# Patient Record
Sex: Female | Born: 1970
Health system: Southern US, Community
[De-identification: ages and names within clinical notes are randomized; demographics above are authoritative.]

---

## 2004-11-11 ENCOUNTER — Ambulatory Visit: Payer: Self-pay | Admitting: Sports Medicine

## 2005-07-11 ENCOUNTER — Ambulatory Visit: Payer: Self-pay | Admitting: Sports Medicine

## 2005-08-08 ENCOUNTER — Ambulatory Visit: Payer: Self-pay | Admitting: Sports Medicine

## 2007-11-08 ENCOUNTER — Encounter: Payer: Self-pay | Admitting: Sports Medicine

## 2007-11-18 ENCOUNTER — Encounter: Payer: Self-pay | Admitting: Sports Medicine

## 2007-12-23 ENCOUNTER — Encounter: Payer: Self-pay | Admitting: Sports Medicine

## 2007-12-24 ENCOUNTER — Encounter: Payer: Self-pay | Admitting: Sports Medicine

## 2007-12-31 ENCOUNTER — Ambulatory Visit: Payer: Self-pay | Admitting: Sports Medicine

## 2007-12-31 DIAGNOSIS — M21619 Bunion of unspecified foot: Secondary | ICD-10-CM

## 2007-12-31 DIAGNOSIS — S3210XA Unspecified fracture of sacrum, initial encounter for closed fracture: Secondary | ICD-10-CM | POA: Insufficient documentation

## 2007-12-31 DIAGNOSIS — S322XXA Fracture of coccyx, initial encounter for closed fracture: Secondary | ICD-10-CM

## 2009-04-27 ENCOUNTER — Ambulatory Visit: Payer: Self-pay | Admitting: Sports Medicine

## 2009-04-27 DIAGNOSIS — M25559 Pain in unspecified hip: Secondary | ICD-10-CM

## 2009-04-27 DIAGNOSIS — M79609 Pain in unspecified limb: Secondary | ICD-10-CM

## 2010-10-13 ENCOUNTER — Encounter: Payer: Self-pay | Admitting: Sports Medicine

## 2010-10-13 ENCOUNTER — Encounter (INDEPENDENT_AMBULATORY_CARE_PROVIDER_SITE_OTHER): Payer: PRIVATE HEALTH INSURANCE | Admitting: Sports Medicine

## 2010-10-13 DIAGNOSIS — M21619 Bunion of unspecified foot: Secondary | ICD-10-CM

## 2010-10-13 DIAGNOSIS — M79609 Pain in unspecified limb: Secondary | ICD-10-CM

## 2010-10-18 NOTE — Assessment & Plan Note (Signed)
Summary: ORTHOTICS,MC   Vital Signs:  Patient profile:   40 year old female Height:      66 inches Weight:      120 pounds BMI:     19.44 Pulse rate:   75 / minute BP sitting:   109 / 75  (left arm)  Vitals Entered By: Rochele Pages RN (October 13, 2010 3:57 PM) CC: orthotics and rt foot pain   CC:  orthotics and rt foot pain.  History of Present Illness: Jniya returns for new orthotics now up to running as much as 18 mi trail runs with no pain until recently orthotics starting to give less support in arch now with some recurrent ankle pain comes for reevaluation great toe pain is controlled w orthotics  Preventive Screening-Counseling & Management  Alcohol-Tobacco     Smoking Status: never  Physical Exam  General:  Well-developed,well-nourished,in no acute distress; alert,appropriate and cooperative throughout examination Msk:  good alignment feet show bunion change 1st MTP RT and less so on left good arch running form pronates without orthotics   Impression & Recommendations:  Problem # 1:  BUNION, RIGHT FOOT (ICD-727.1)  Patient was fitted for a : standard, cushioned, semi-rigid orthotic. The orthotic was heated and afterward the patient stood on the orthotic blank positioned on the orthotic stand. The patient was positioned in subtalar neutral position and 10 degrees of ankle dorsiflexion in a weight bearing stance. After completion of molding, a stable base was applied to the orthotic blank. The blank was ground to a stable position for weight bearing. Size: 8 blue swirl Base: EVA Posting: None Additional orthotic padding: None  Note we also added some new heel padding tapered to heels of old orthotics  Walking and running gait neutral with orthotics  Orders: [employer], each unit [phone])  Problem # 2:  FOOT PAIN, BILATERAL (ICD-729.5) Assessment: Improved  much imporved using orthoitcs  will cont these  reck as needed and to replace as  needed  Orders: [employer], each unit (L3002)   Orders Added: 1)  Est. Patient Level III [60454] 2)  Orthotic Materials, each unit [L3002]

## 2013-12-17 ENCOUNTER — Ambulatory Visit (INDEPENDENT_AMBULATORY_CARE_PROVIDER_SITE_OTHER): Payer: 59 | Admitting: Sports Medicine

## 2013-12-17 ENCOUNTER — Encounter: Payer: Self-pay | Admitting: Sports Medicine

## 2013-12-17 VITALS — BP 143/82 | Ht 66.0 in | Wt 120.0 lb

## 2013-12-17 DIAGNOSIS — S76319A Strain of muscle, fascia and tendon of the posterior muscle group at thigh level, unspecified thigh, initial encounter: Secondary | ICD-10-CM

## 2013-12-17 DIAGNOSIS — IMO0002 Reserved for concepts with insufficient information to code with codable children: Secondary | ICD-10-CM

## 2013-12-17 MED ORDER — NITROGLYCERIN 0.2 MG/HR TD PT24: MEDICATED_PATCH | TRANSDERMAL | Status: DC

## 2013-12-17 NOTE — Patient Instructions (Signed)
Thank you for coming in today  1. You have a high hamstring strain 2. Sit on a donut or pillow 3. May start jogging if not limping. No downhill or speed. No running 2 consecutive days. 4. Wear compression sleeve during exercise 5. No static stretching, just rehab. 6. Heel lift in running shoes 7. Nitro patch  Nitroglycerin Protocol   Apply 1/4 nitroglycerin patch to affected area daily.  Change position of patch within the affected area every 24 hours.  You may experience a headache during the first 1-2 weeks of using the patch, these should subside.  If you experience headaches after beginning nitroglycerin patch treatment, you may take your preferred over the counter pain reliever.  Another side effect of the nitroglycerin patch is skin irritation or rash related to patch adhesive.  Please notify our office if you develop more severe headaches or rash, and stop the patch.  Tendon healing with nitroglycerin patch may require 12 to 24 weeks depending on the extent of injury.  Men should not use if taking Viagra, Cialis, or Levitra.   Do not use if you have migraines or rosacea.

## 2013-12-17 NOTE — Progress Notes (Signed)
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 She pulled some weeds and felt like she strained her high hamstring. 2 days later she ran a half marathon race and notes that the pain was severely worse. Since that time she has not been able to walk or run without pain. She has tried rest, ice, and ibuprofen. She took prescription strength ibuprofen for several days but this did not really help her. She does note pain with sitting. She is concerned she does not feel like she is getting better despite rest.  Her current workout routine includes running 4 days a week for a total of 30 miles per week as well as biking one day a week and doing strict training 4 days a week.  ROS: As above in the HPI. All other systems are stable or negative.  OBJECTIVE: APPEARANCE:  Patient in no acute distress.The patient appeared well nourished and normally developed. HEENT: No scleral icterus. Conjunctiva non-injected Resp: Non labored Skin: No rash MSK:  Left hamstring: - 5 out of 5 strength on hamstring testing with knee flexion and hip extension. She does have mild discomfort with hip extension against resistance. - No palpable defect - Mild tenderness to palpation at the lateral aspect of the left ischial tuberosity at the insertion of the biceps femoris - Pain with hamstring H. test but with equal flexibility - Pain with diver exercises  MSK US: Limited ultrasound of the right proximal hamstring was performed in transverse and longitudinal views today. There was a hyperechoic fragment consistent with a small avulsion off of the fascial tuberosity it was present in both transverse and longitudinal views. Suspect that this is a small a avulsion from biceps femoris  ASSESSMENT: #1. Left high hamstring strain   PLAN: We will add a heel lift in her shoe.  She will wear a compression sleeve over her hamstring while exercising. She should sit on a doughnut or pillow to relieve pressure and pain while sitting. She may start to gradually ease back into jogging when she is able to do so without limping. She was encouraged to avoid downhill running and speed work. She should return to running very gradually. She was given the Askling hamstring protocol to start rehabilitation. We will also start a nitroglycerin patch. We will see her back in 6 weeks or sooner if needed.

## 2014-01-28 ENCOUNTER — Encounter: Payer: Self-pay | Admitting: Sports Medicine

## 2014-01-28 ENCOUNTER — Ambulatory Visit (INDEPENDENT_AMBULATORY_CARE_PROVIDER_SITE_OTHER): Payer: 59 | Admitting: Sports Medicine

## 2014-01-28 VITALS — BP 106/72 | Ht 66.0 in | Wt 118.0 lb

## 2014-01-28 DIAGNOSIS — M6580 Other synovitis and tenosynovitis, unspecified site: Secondary | ICD-10-CM

## 2014-01-28 DIAGNOSIS — M76899 Other specified enthesopathies of unspecified lower limb, excluding foot: Secondary | ICD-10-CM

## 2014-01-28 NOTE — Patient Instructions (Signed)
You are healing well. Continue to wear your sleeve and the left heel lift. After 3 weeks you can try with and without the heel lift. Continue the nitroglycerin patches. Add 5 miles per week to your distance. You can begin to jog downhill and run uphill. Continue exercises. Add dynamic exercises: -Hop on on one leg forward. Do 3 sets of 10. Do this 3 times per week at the end of your run when you are loose. -Do easy bounding long strides 10 times across about 75 yards, during 2-3 runs per week -Gradually add the Nordic hamstring exercises. -In 3 weeks you can begin to run downhill. -After your runs you can do easy static stretches. -Followup in 6 weeks or sooner as needed.

## 2014-01-28 NOTE — Progress Notes (Signed)
Subjective:   CC: Followup left high hamstring strain  HPI:   This is a very pleasant 43 year old female with a history of left high hamstring strain with small avulsion fracture. She was seen in a little over a month ago and prescribed a compression sleeve, gradually easing back into exercise, avoiding downhill running, Askling hamstring protocol for rehabilitation, and nitroglycerin patch. A heel lift was also added into her left shoe. She reports that she has been doing rehabilitation exercises consistently and wearing the compression sleeve. She has also intermittently used the nitroglycerin patch. She is now able to slowly jog on a soft flat or mildly rolling terrain. Towards the end of running she can feel muscles but they are not sore. She did have a small setback when she picked strawberries a few weeks ago because she was bending for greater than 30 minutes. She still occasionally feels a dull ache at the upper left thigh around her buttock especially if she does too much bending or running. She is not limping. She has questions about how she can progress at this point.  She rates her pain as 80% less than original injury p 6 wks of RX (9wks since injury)  Review of Systems - Per HPI.  Social history: Patient is a long distance mountain runner    Objective:  Physical Exam BP 106/72  Ht 5\' 6"  (1.676 m)  Wt 118 lb (53.524 kg)  BMI 19.05 kg/m2 GEN: NAD Extremities: Mild tenderness at left ischial tuberosity 5 out of 5 left knee flexion Left hip flexion is within 5 of right, within slight increased bend in the left knee Normal gait while walking, decreased left leg and left while running Normal pelvic rock Normal leg alignment 5 out of 5 hip abduction on the left with foot inversion and eversion    MSK ultrasound: Left ischial avulsion still present but decreased in size, small ischial bursitis is present, small amount of edema within hamstring and below  Assessment:      Jacqueline Michael is a 43 y.o. female with h/o high HS syndrome here for f/u    Plan:     # See problem list and after visit summary for problem-specific plans. HS tendinitis protocol  # Health Maintenance: cont f/u of allergy issues  Follow-up: Follow up in 6wks for reck and repeat US   Leona SingletonMaria T Thekkekandam, MD [facility]   Pasty ArchEdited   KB Fields, MD

## 2014-01-28 NOTE — Assessment & Plan Note (Signed)
This is improved by symptoms and on scan  Small amount of ischial bursitis probably causes pain with sitting  Keep up NTG protocol for 6 more weeks HS sleeve Heel lift Start modifying these at 3 to 6 wks  OK to gradually increase training  Add hops, bounding and easy stretching  Reck 6 wks

## 2014-03-10 ENCOUNTER — Encounter: Payer: Self-pay | Admitting: Sports Medicine

## 2014-03-10 ENCOUNTER — Ambulatory Visit (INDEPENDENT_AMBULATORY_CARE_PROVIDER_SITE_OTHER): Payer: 59 | Admitting: Sports Medicine

## 2014-03-10 VITALS — BP 113/74 | HR 65 | Ht 66.0 in | Wt 120.0 lb

## 2014-03-10 DIAGNOSIS — M6580 Other synovitis and tenosynovitis, unspecified site: Secondary | ICD-10-CM

## 2014-03-10 DIAGNOSIS — M76899 Other specified enthesopathies of unspecified lower limb, excluding foot: Secondary | ICD-10-CM

## 2014-03-10 NOTE — Patient Instructions (Signed)
Running drills: Interval training 10 X 200, 5K pace, once a week  Strengthening: Continue hopping, extenders, and bounding  Start squats and lunges with free weight starting with light 20-30 lbs on the bar  Continue compression sleeve

## 2014-03-10 NOTE — Assessment & Plan Note (Signed)
Patient has made great progress with resolution of the avulsion on US and less inflammation of the bursa. Recommend returning to running as tolerated. Working on interval training with running 10 x 200 meter at [employer] pace, continue strengthening exercise with the addition on squats and lunges with weight. Reassured patient that pain with sitting is common and can take time to resolve. Follow up as needed. Continue hamstring sleeve for 6 more week then slowly transition out the of the sleeve.

## 2014-03-10 NOTE — Progress Notes (Signed)
  Jacqueline Michael - 43 y.o. female MRN [id number]  Date of birth: 04/13/1971  SUBJECTIVE:  Including CC & ROS.  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 Patient is currently about 15 week out from original injury that occurred at a race in April. Since she first presented patient has been resting some, using Nitro protocol, hamstring sleeve, and working on strengthening. She has since been able to return to running about the same mileage as before of 25-30 miles a week. She has able to run 13 miles of hills over the weekend with only some difficulty with feeling some weakness and lag in her left leg within her stride compared to right. She is interesting in starting speed work and wants to know if the soreness with sitting is normal.   She felt good relief of pain with NTG in about 2 weeks    ROS: Review of systems otherwise negative except for information present in HPI  HISTORY: Past Medical, Surgical, Social, and Family History Reviewed & Updated per EMR. Pertinent Historical Findings include: nonsmoker  DATA REVIEWED: Previous MSK US from May and Junr 2015  PHYSICAL EXAM:  VS: BP:113/74 mmHg  HR:65bpm  TEMP: ( )  RESP:   HT:5\' 6"  (167.6 cm)   WT:120 lb (54.432 kg)  BMI:19.4 PHYSICAL EXAM: General: well nourished Skin of LE: warm; dry, no rashes, lesions, ecchymosis or erythema. Vascular: dorsal pedal pulses 2+ bilaterally Neurologically: Sensation to light touch lower extremities equal and intact bilaterally.  Left hamstring:  - 5 out of 5 strength on hamstring testing with knee flexion and hip extension with hip extension/adduction and extension/abduction, also though strength is equivilant she is slightly less strong on the left - No palpable defect  - Mild tenderness to palpation at the lateral  aspect of the left ischial tuberosity at the insertion of the biceps femoris  - Normal 90 degree with hamstring H. test but with equal flexibility   MSK US: Limited ultrasound of the right proximal hamstring was performed in transverse and longitudinal.  The hyperechoic fragment consistent with a small avulsion has healed with no significant changes to the Tuberosity. The bursal inflammation has also resolved.   ASSESSMENT & PLAN: See problem based charting & AVS for pt instructions.

## 2015-03-10 ENCOUNTER — Ambulatory Visit (INDEPENDENT_AMBULATORY_CARE_PROVIDER_SITE_OTHER): Payer: 59 | Admitting: Sports Medicine

## 2015-03-10 ENCOUNTER — Encounter: Payer: Self-pay | Admitting: Sports Medicine

## 2015-03-10 VITALS — BP 123/69 | HR 65 | Ht 66.0 in | Wt 122.0 lb

## 2015-03-10 DIAGNOSIS — G5701 Lesion of sciatic nerve, right lower limb: Secondary | ICD-10-CM

## 2015-03-10 NOTE — Progress Notes (Signed)
   Subjective:    Patient ID: Jacqueline Michael, female    DOB: 11-12-1970, 44 y.o.   MRN: [id number]  HPI  Patient comes in today complaining of some posterior right hip pain. She has had issues with her piriformis in the past. She has been given hip abductor and pelvic stabilizer exercises and has been very diligent about doing them. Last week she began to experience some posterior right hip pain with running. Also getting some pain with sitting. Some occasional radiating pain down the leg as well. She has custom orthotics which are very comfortable but she has a bunion on her right great toe which is starting to wear a small hole into the orthotic.    Review of Systems     Objective:   Physical Exam Well-developed, well-nourished. No acute distress  Right hip: Good range of motion. She is tender to palpation directly in the middle of the piriformis. Negative Trendelenburg. Good hip abductor strength.  Right foot: Small to moderate sized bunion with callus formation. Nontender to palpation.  Excellent running form.       Assessment & Plan:  Right hip pain secondary to piriformis syndrome Bunion  I've added a small first ray post to the right orthotic to help cushion the area of additional pressure that the bunion is causing. I've also given her some advanced step exercises for her hip pain. We also discussed the possibility of physical therapy. She lives in [place] and will get me the name of a physical therapist that she would like to work with and I will fax him a prescription. She will follow-up with me as needed.

## 2015-03-31 ENCOUNTER — Other Ambulatory Visit: Payer: Self-pay | Admitting: *Deleted

## 2015-03-31 ENCOUNTER — Telehealth: Payer: Self-pay | Admitting: *Deleted

## 2015-03-31 DIAGNOSIS — M25551 Pain in right hip: Secondary | ICD-10-CM

## 2015-03-31 NOTE — Telephone Encounter (Signed)
Faxed over order to [facility] rehab for PT.

## 2015-03-31 NOTE — Telephone Encounter (Signed)
-----   Message from Ralene Cork, DO sent at 03/30/2015  2:48 PM EDT ----- Regarding: RE: order request Contact: [phone] Yes  ----- Message -----    From: Linward Headland, RN    Sent: 03/30/2015   2:19 PM      To: Ralene Cork, DO Subject: FW: order request                              Is this ok to setup?  ----- Message -----    From: Jacqueline Michael    Sent: 03/26/2015   9:49 AM      To: Linward Headland, RN Subject: order request                                  Pt called asking for an order faxed to [facility] for U/s piriformis treatment  Fax number is [phone]

## 2015-04-19 ENCOUNTER — Ambulatory Visit
Admission: RE | Admit: 2015-04-19 | Discharge: 2015-04-19 | Disposition: A | Discharge status: Home / Self Care | Payer: 59 | Source: Ambulatory Visit | Attending: Sports Medicine | Admitting: Sports Medicine

## 2015-04-19 ENCOUNTER — Ambulatory Visit: Payer: 59 | Admitting: Sports Medicine

## 2015-04-19 ENCOUNTER — Encounter: Payer: Self-pay | Admitting: Sports Medicine

## 2015-04-19 ENCOUNTER — Ambulatory Visit (INDEPENDENT_AMBULATORY_CARE_PROVIDER_SITE_OTHER): Payer: 59 | Admitting: Sports Medicine

## 2015-04-19 VITALS — BP 129/71 | Ht 66.0 in | Wt 122.0 lb

## 2015-04-19 DIAGNOSIS — M25552 Pain in left hip: Secondary | ICD-10-CM

## 2015-04-19 NOTE — Progress Notes (Signed)
Patient ID: Jacqueline Michael, female   DOB: [birth year], 44 y.o.   MRN: [id number]  HPI: [name] is a 44 year old female with a history of R pelvic avulsion presenting for a 2 week history of acute onset R hip pain.  The patient is a distance runner, denies any recent increase in training intensity, as she has an upcoming marathon.  Pain began 2 weeks ago around mile 11 and progressively worsened on her last 7 miles, limping by the end of her run.  Pain is sharp, located in the groin/lateral hip, and worsened with stairs, hip flexion, and hopping on R foot.  Has pain at night, but does not awaken her from sleep.  Had complete rest for 3 days with NSAIDs and icing with some improvement, but had same pain with <1 mile run.  Patient is usually on Ca/Vit D, but has not been taking her daily supplement; recently started re-taking as she has become dairy intolerant ~ 6 months ago.  She does not recall having a Ca/Vit D level checked.  Still having regular menstruation cycles.  She is currently not running/cycling.  She was seen 03/10/15 for suspected piriformis syndrome and received PT evaluation who thinks feels this is lower back etiology.   Filed Vitals:   04/19/15 1511  BP: 129/71   Physical Exam: General: Fit, middle-aged female sitting comfortably on exam table in no apparent distress.  Lower extremities:  Full ROM with passive L hip flexion, pain with resisted L hip flexion. L hip internal rotation limited by pain. FABER positive on L, notes pain in lateral hip. FADDIR positive on L. Hip abductors slightly weak on L.  Deferred hop test in setting of patients reported pain with hopping and hip compression.  Assessment/Plan:  [name] is a 44 year old female with a history of R pelvic avulsion presenting for a 2 week history of acute onset R hip pain found to have with resisted hip flexion and internal rotation of the hip.  Her presentation is most concerning for femoral neck stress  fracture due to the patient's age, history of long distance running, sub-optimal Vit D/Ca replacement and decreased dairy intake, and history/physical exam.  Other differential items considered were OA, labral tear/FAI (FADDIR positive) AVN (although no risk factors), and iliopsoas bursitis due to groin pain and pain with resisted hip flexion. Greater trochanter bursitis was also considered due to reported pain in lateral hip with FABER testing. - AP plain films of pelvis; if no sign of femoral neck stress fracture will proceed with MRI L hip due to high clinical suspicion - Pending XRAY results, will make completely non-weight bearing (i.e., crutches) and discuss further management options (surgery vs. Non weight-bearing for 6 weeks) - Stop PT for now  - NSAIDs PRN for pain - Continue Vit D/Ca supplementation - Will call with XRAY results and re-evaluate management at that time  Fontaine No, MS4  Patient seen and evaluated with the above-named medical student. I agree with her physical exam findings and plan of care. Patient's history and physical exam is highly suspicious for a femoral neck stress fracture. I would like to proceed with imaging as described above to rule out a femoral neck stress fracture. Phone follow-up after those studies to discuss the results and delineate further treatment. Absolutely no running in the interim. Patient understands the risk of femoral neck fracture with continued running.

## 2015-04-22 ENCOUNTER — Ambulatory Visit
Admission: RE | Admit: 2015-04-22 | Discharge: 2015-04-22 | Disposition: A | Discharge status: Home / Self Care | Payer: 59 | Source: Ambulatory Visit | Attending: Sports Medicine | Admitting: Sports Medicine

## 2015-04-22 ENCOUNTER — Telehealth: Payer: Self-pay | Admitting: Sports Medicine

## 2015-04-22 DIAGNOSIS — M25552 Pain in left hip: Secondary | ICD-10-CM

## 2015-04-22 DIAGNOSIS — S72002A Fracture of unspecified part of neck of left femur, initial encounter for closed fracture: Secondary | ICD-10-CM

## 2015-04-22 NOTE — Telephone Encounter (Signed)
I spoke with the patient on the phone today after reviewing the MRI of her left hip. MRI does in fact confirm a femoral neck stress fracture. It is on the compression side of the bone and involves approximately 40% of the femoral neck. Patient has crutches at home. I've instructed her to become strictly nonweightbearing on her crutches for the next 2 weeks. After that time she may start some limited weightbearing with her crutches. I explained to her that she will be on crutches for about the next 6 weeks. If she is relatively pain-free in 2 weeks she may start some light biking and light swimming but she understands that she is to stop if she gets any sort of pain. I will see her back in the office in 4 weeks. I will get a follow-up plain x-ray prior to that visit to evaluate for healing. I've strongly encouraged her to follow-up with her PCP regarding testing for vitamin D, calcium, and osteoporosis. She is asking about trying some topical nitroglycerin. I've explained to her that I do not think it will cause any harm. She understands that this is a significant injury that will require time to heal.

## 2015-04-22 NOTE — Addendum Note (Signed)
Addended by: Annita Brod on: 04/22/2015 10:20 AM   Modules accepted: Orders

## 2015-05-18 ENCOUNTER — Other Ambulatory Visit: Payer: Self-pay | Admitting: Sports Medicine

## 2015-05-18 ENCOUNTER — Encounter: Payer: Self-pay | Admitting: Sports Medicine

## 2015-05-18 ENCOUNTER — Ambulatory Visit (INDEPENDENT_AMBULATORY_CARE_PROVIDER_SITE_OTHER): Payer: 59 | Admitting: Sports Medicine

## 2015-05-18 ENCOUNTER — Ambulatory Visit
Admission: RE | Admit: 2015-05-18 | Discharge: 2015-05-18 | Disposition: A | Discharge status: Home / Self Care | Payer: 59 | Source: Ambulatory Visit | Attending: Sports Medicine | Admitting: Sports Medicine

## 2015-05-18 VITALS — BP 140/75 | Ht 66.0 in | Wt 122.5 lb

## 2015-05-18 DIAGNOSIS — S72002A Fracture of unspecified part of neck of left femur, initial encounter for closed fracture: Secondary | ICD-10-CM

## 2015-05-18 DIAGNOSIS — M84353D Stress fracture, unspecified femur, subsequent encounter for fracture with routine healing: Secondary | ICD-10-CM

## 2015-05-18 DIAGNOSIS — M84359D Stress fracture, hip, unspecified, subsequent encounter for fracture with routine healing: Secondary | ICD-10-CM

## 2015-05-18 NOTE — Progress Notes (Addendum)
   Subjective:    Patient ID: Jacqueline Michael, female    DOB: [birth year], 44 y.o.   MRN: [id number]  HPI  44 y/o female who presents in follow up of femoral neck stress fracture diagnosed a month ago. She has been mostly nonweightbearing on crutches for the past month, with some minimal weightbearing using the crutches.  She has been pain free with the minimal weightbearing, swimming, and using the trainer.  She has not had her vitamin D, calcium, PTH, DEXA scan checked by PCP and asks if this could be ordered today.  She will follow up with her PCP for results.  She stopped eating dairy since she began having a dairy allergy a year and a half ago.  She has increased her calcium and vitamin D supplementation.  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   Has redness and itching of left ear of 2 days duration.  Has been placing benadryl cream with relief of pruritis.  Spent some time outside on Sunday, believe may have been bitten by something.       Review of Systems MSK: denies joint pain or swelling Neuro: denies numbness, paresthesias Skin: +rash, pruritis    Objective:   Physical Exam  Constitutional: She is oriented to person, place, and time. She appears well-developed and well-nourished.  Musculoskeletal: Normal range of motion.  Neurological: She is alert and oriented to person, place, and time.  Skin: Skin is warm and dry. Rash noted.  Has erythema over left helix of ear, no fluctuance, bleeding, no ulcers present.  Psychiatric: She has a normal mood and affect. Her behavior is normal. Judgment and thought content normal.  MSK: Hip- no deformities or swelling present over hips   Palpation- no TTP on bilateral hips, no TTP over greater trochanter or IT band   ROM- Full ROM of bilateral hips   Muscle strength- 4/5 muscle strength of left hip flexor, 5/5 right hip flexor.  Has mild pain over proximal thigh.  Otherwise 5/5 muscle strength of abduction,  adduction, extension, IR, ER of bilateral hips   Special tests- FABER, FADDIR neg   Neurovasc- sensation intact  X-rays of the left hip performed earlier today shows slight sclerosis along the femoral neck consistent with a healing stress fracture.          Assessment & Plan:  Left femoral neck stress fracture- Improved, will progress to weightbearing as tolerated off crutches. I will have her follow the UpToDate guidelines for return to activity. Gave instructions for using crutches again if having pain related to stress fracture again.  Will allow for walking, light biking, massage therapy.  Avoid running, high impact activity, extensive hikes, biking with resistance.  May add minimal resistance after 2 weeks of walking as tolerated.  Will check calcium, vitamin D, PTH, DEXA scan and she is to follow up with PCP.  Will get repeat pelvis x-rays in 1 month and follow up office visit at that time.    Left ear contact dermatitis- Likely had an allergic reaction on left ear.  Has been placing benadryl cream with some relief.  Recommend benadryl, hydrocortisone cream.  Can follow up with PCP if no improvement.

## 2015-05-19 ENCOUNTER — Other Ambulatory Visit: Payer: Self-pay | Admitting: *Deleted

## 2015-05-19 DIAGNOSIS — M25552 Pain in left hip: Secondary | ICD-10-CM

## 2015-05-19 NOTE — Progress Notes (Unsigned)
Faxed order for Dexa bone density to [facility] [place].  Patient appt is scheduled for Thurs Nov 3 @7 :30am

## 2015-06-03 ENCOUNTER — Encounter: Payer: Self-pay | Admitting: Sports Medicine

## 2015-06-14 ENCOUNTER — Encounter: Payer: Self-pay | Admitting: Sports Medicine

## 2015-06-14 ENCOUNTER — Ambulatory Visit (INDEPENDENT_AMBULATORY_CARE_PROVIDER_SITE_OTHER): Payer: 59 | Admitting: Sports Medicine

## 2015-06-14 ENCOUNTER — Other Ambulatory Visit: Payer: Self-pay | Admitting: Sports Medicine

## 2015-06-14 ENCOUNTER — Ambulatory Visit
Admission: RE | Admit: 2015-06-14 | Discharge: 2015-06-14 | Disposition: A | Discharge status: Home / Self Care | Payer: 59 | Source: Ambulatory Visit | Attending: Sports Medicine | Admitting: Sports Medicine

## 2015-06-14 VITALS — BP 121/68 | HR 67 | Wt 122.0 lb

## 2015-06-14 DIAGNOSIS — M84359D Stress fracture, hip, unspecified, subsequent encounter for fracture with routine healing: Secondary | ICD-10-CM

## 2015-06-14 DIAGNOSIS — M25552 Pain in left hip: Secondary | ICD-10-CM

## 2015-06-14 DIAGNOSIS — M84353D Stress fracture, unspecified femur, subsequent encounter for fracture with routine healing: Secondary | ICD-10-CM

## 2015-06-14 NOTE — Progress Notes (Signed)
Subjective:    Patient ID: Jacqueline Michael, female    DOB: May 27, 1971, 44 y.o.   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  She is now 8 weeks removed from her diagnosis. She came off of crutches around 4 weeks and completely came off of periods of nonweightbearing around 5.5 weeks. She is now up to 45 minutes on the trainer daily without pain. She can walk up to 2 miles at a 16 minute pace without pain. However, when pushing herself to walk faster pace around the 15 minute mile, she will start to feel a dullness in her left groin. This occurs near the end of her 2 mile walk.  She never has any swelling in the leg. She is otherwise pain-free. During the week she sits behind a desk at work. But on Saturday and Sunday she does housework, home maintenance, yard work and exercises. She reports she spends most of the day on her legs during the weekends and that is when she gets the occasional dullness described above.   No weakness at any point. No paresthesias at any point. No falls. No instability. No new injuries.  PMHx: -- Reviewed & updated in EMR  PSHx: -- Reviewed & updated in EMR   Meds: -- Reviewed & updated in EMR  Allergies: -- Reviewed & updated in EMR  Social Hx: -- Reviewed & updated in EMR  Review of Systems a 10 point review of systems was negative other than detailed above      Objective:   Physical Exam   General: -- Well appearing and in NAD; resting comfortably on exam table  Cardiopulmonary: -- Non-labored respirations; 2+ pulses in distal upper and lower extremities  Skin/derm: -- No rashes present on the examined skin of the lower extremities  Neurology: -- No focal deficits on interview/exam -- Neurovascular intact in examined extremities  Psych: -- Appropriate  mood/affect; normal thought content   bilateral hip exam: -She is nontender to palpation along all bony landmarks of the lateral hip and the bilateral inguinal folds -There is symmetric and appropriate range of motion bilaterally. Flexion to 120. Extension to 40. Internal rotation to 30. External rotation to 60. Abduction to 60. -5/5 strength in hip flexion, extension, internal rotation/external rotation and abduction -All of the above was without pain  -Negative logroll bilaterally -Negative axial load/compression on femoral acetabular joint bilaterally -There is a negative single leg hop bilaterally, demonstrates good balance and is pain-free with both   Labs: -Reviewed, normal vitamin D, PTH and calcium   Radiography: -Reviewed previous MRI and x-rays of LEFT hip -Reviewed the new hip Films from today, 06/14/2015, no radiographic evidence of bony instability/cortical disruption or regression/poor healing    Assessment & Plan:    44 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 She has maximized her activity and is ready for return to run protocol. She had an unremarkable physical exam and no radiographic evidence of injury/worsening or poor healing. The patient had a DEXA scan ordered for her stress fracture, the results of which are still pending.  Plan: -Obtain DEXA results today, call patient with results -Patient provided with a return to running protocol for uncomplicated compression side stress fractures of the femur -Begin the return to running protocol, she may resume yoga  with pain as her guide -She should not perform any high impact activity (cross fit, burpees, jumping until completing the entire return to running program, which will take 8 more weeks; 16 weeks removed from diagnosis) -Follow up in 4-6 weeks to assess response to protocol, may return sooner for pain or other  symptoms   Addendum: -DEXA scan results were sent later this morning. -Patient with osteopenia in her femoral neck and osteoporosis in her lumbar spine -At primary care follow-up, recommend she review these results and consider the initiation of bisphosphonate therapy or other bone re-mineralization therapy based on discussion with primary care physician and her FRAX score

## 2015-06-18 ENCOUNTER — Encounter: Payer: Self-pay | Admitting: Sports Medicine

## 2015-07-05 ENCOUNTER — Encounter: Payer: Self-pay | Admitting: Sports Medicine

## 2015-11-16 ENCOUNTER — Other Ambulatory Visit: Payer: Self-pay | Admitting: *Deleted

## 2015-11-16 DIAGNOSIS — M545 Low back pain, unspecified: Secondary | ICD-10-CM

## 2016-05-30 ENCOUNTER — Ambulatory Visit (INDEPENDENT_AMBULATORY_CARE_PROVIDER_SITE_OTHER): Payer: 59 | Admitting: Sports Medicine

## 2016-05-30 ENCOUNTER — Ambulatory Visit
Admission: RE | Admit: 2016-05-30 | Discharge: 2016-05-30 | Disposition: A | Discharge status: Home / Self Care | Payer: 59 | Source: Ambulatory Visit | Attending: Sports Medicine | Admitting: Sports Medicine

## 2016-05-30 ENCOUNTER — Encounter: Payer: Self-pay | Admitting: Sports Medicine

## 2016-05-30 VITALS — BP 125/86 | HR 64 | Ht 66.0 in | Wt 120.0 lb

## 2016-05-30 DIAGNOSIS — M25552 Pain in left hip: Secondary | ICD-10-CM

## 2016-05-30 NOTE — Progress Notes (Signed)
   Subjective:    Patient ID: Jacqueline Michael, female    DOB: 07/12/1971, 45 y.o.   MRN: [id number]  HPI chief complaint: Left hip pain  Patient is a 45 year old female that comes in today complaining of several weeks of diffuse left hip pain. She has a history of a left hip femoral neck stress fracture diagnosed via MRI one year ago. She was treated conservatively and made a full recovery. She has been able to return to running. A DEXA scan done at that time showed her to be osteoporotic. She is currently taking calcium and vitamin D. A few weeks ago she began to develop some left-sided groin pain which has now become more diffuse. She describes a feeling of "tightness" in her hip as well. She is not limping. She took 2 weeks off from running but her pain returned immediately when she tried to resume. Pain will radiate down her thigh to her knee. No numbness or tingling. No pain past the knee. No low back pain.  Interim medical history is reviewed. It is significant for osteoporosis Medications reviewed Allergies reviewed    Review of Systems    as above Objective:   Physical Exam  Well-developed, fit appearing. No acute distress. Vital signs reviewed.  Left hip: Patient has reproducible groin pain with axial loading and internal rotation of the left hip. Painless external rotation. No tenderness to palpation over the greater trochanteric bursa or over the superficial hip flexors. No pain with resisted hip flexion. Negative hop test. Negative straight leg raise. Neurovascularly intact distally.  X-rays of her left hip show no significant degenerative changes and no obvious stress fracture       Assessment & Plan:   Left hip pain worrisome for returning femoral neck stress reaction versus stress fracture Status post previous left hip stress fracture Osteoporosis  I'm concerned that this patient has developed a new early stress fracture in her left femoral neck. We need to get an MRI  to evaluate further. In the meantime, patient is instructed to avoid all impact activities. She is able to walk comfortably without a limp so I do not think we need to make her nonweightbearing. I believe one of the reasons that she continues to get injuries such as this is a negative energy balance related to her caloric intake and her activity level. She has developed several food allergies over the past few years including an allergy to nuts and dairy. This makes it very difficult for her to meet the nutritional requirements for her to stay as physically active as she would like. I would like her to meet with [name] for a nutritional consultation. I will call her with the results of her MRI once available at which point we will delineate a more definitive treatment plan.

## 2016-06-01 ENCOUNTER — Ambulatory Visit
Admission: RE | Admit: 2016-06-01 | Discharge: 2016-06-01 | Disposition: A | Discharge status: Home / Self Care | Payer: 59 | Source: Ambulatory Visit | Attending: Sports Medicine | Admitting: Sports Medicine

## 2016-06-01 DIAGNOSIS — M25552 Pain in left hip: Secondary | ICD-10-CM

## 2016-06-02 ENCOUNTER — Other Ambulatory Visit: Payer: Self-pay | Admitting: *Deleted

## 2016-06-02 ENCOUNTER — Telehealth: Payer: Self-pay | Admitting: Sports Medicine

## 2016-06-02 DIAGNOSIS — M810 Age-related osteoporosis without current pathological fracture: Secondary | ICD-10-CM

## 2016-06-02 NOTE — Telephone Encounter (Signed)
  I spoke with Jacqueline Michael on the phone today after reviewing the MRI of her left hip. Luckily, there is no evidence of a stress fracture or stress reaction. She does have some mild early DJD and a small hip effusion. I recommended over-the-counter anti-inflammatories and activity modification as needed. We also discussed glucosamine/chondroitin. We can entertain the idea of an intra-articular cortisone injection if her symptoms worsen. She is awaiting her nutritional consult with [name]. Of note, her current orthotics are quite worn so she will schedule an appointment in the near future for new custom orthotics.

## 2016-06-12 ENCOUNTER — Encounter: Payer: Self-pay | Admitting: Sports Medicine

## 2016-07-11 ENCOUNTER — Encounter: Payer: Self-pay | Admitting: Family Medicine

## 2016-07-11 ENCOUNTER — Encounter: Payer: Self-pay | Admitting: Sports Medicine

## 2016-07-11 ENCOUNTER — Ambulatory Visit (INDEPENDENT_AMBULATORY_CARE_PROVIDER_SITE_OTHER): Payer: 59 | Admitting: Sports Medicine

## 2016-07-11 ENCOUNTER — Ambulatory Visit (INDEPENDENT_AMBULATORY_CARE_PROVIDER_SITE_OTHER): Payer: 59 | Admitting: Family Medicine

## 2016-07-11 VITALS — BP 119/64 | Ht 66.0 in | Wt 123.0 lb

## 2016-07-11 DIAGNOSIS — M25552 Pain in left hip: Secondary | ICD-10-CM | POA: Diagnosis not present

## 2016-07-11 DIAGNOSIS — M810 Age-related osteoporosis without current pathological fracture: Secondary | ICD-10-CM

## 2016-07-11 NOTE — Progress Notes (Signed)
Medical Nutrition Therapy:  Appt start time: 1000 end time:  1100.  Assessment:  Primary concerns today: bone health.   Learning Readiness: Change in progress Shametra has made some changes based on a book she's been reading, "Bone Health for the Endurance Athlete."  Changes include: Increasing protein (aiming for 100 g/day) and avoiding added sugars.  She has also started more resistance exercise.    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  She lives her 15-YO daughter and husband.  She tries to get to bed ~8:30 PM b/c she gets up at 5 AM most days.    Medical history: -Jadore has had renal stones, possibly related to benign parathyroid growth; no problems since PT gland removed. -Food sensitivities in recent years, i.e., nuts, dairy, and to some degree, eggs.  Symptoms include mainly sinus congestion.   -Suffered a stress frx in Nov 2016, and bone scan showed osteoporosis in lumbar spine (Z = -2.4).   -Recent MRI shows some OA in hip and probable small effusion frx.   -Teniola has started having some heart palpitations (fall 2017) following an intestinal infection she got in [place] for which she took Cipro, then probiotics for 3 weeks.  Cardiac w/u was negative, but Aliveah has found her palpitations go away in both day and night time if she consumes two e-lyte drinks thru the day, or 250 mg Mg supplement in the morning seems to be associated with no palpitations during the day only.    Usual eating pattern includes 3 meals and 2 snacks per day. Frequent foods and beverages include water, eggs, spinach, other veg's, pumpkin seeds, fruit, e'lyte drinks, Ca-fortified o.j., coconut milk, wine/beer 3-4 X wk.  Avoided foods include dairy and nuts (sensitivity), fish and seafood (disliked).   Usual physical activity includes running 3-4 mi 2 X wk, and ~7 mi 1 X wk (10-min pace); cycling 1-2 hrs 2 X wk;  high-resistance training 10-30 min 5 X wk; and pre-hab and rehab exercises 10-20 min 7 X wk, e.g., hip and hamstring ex's (mostly body wt, light weights/bands).    24-hr recall: (Up at 5 AM)  Worked out at 5:15 (cycle 25 min; 15 min wt training) B (6:30 AM)-   1/2 c dry oatmeal, ~3/4 c coconut milk, 2 T cashews, vegan pro powder (30 total g), 5 oz fortif'd o.j. Snk (10 AM)-   1 apple, 2 T sunflwer sds L (12:30 PM)-  1 spinach salad, grilled chx, 1 1/2 T balsamic drsng, 8 Triscuits, Nuun e'lyte drink Snk (4 PM)-  1 Luna bar, water D (6 PM)-  1/2 cashew butter sandw, 1 banana, 8 Triscuits, water Snk (7 PM)-  1 slc cucumber, carrots, 4 chx nuggets, 4 oz lemonade Snk (9 PM)-  water Typical day? No.  Dinner was on the road; usually cooks dinner at home.    Progress Towards Goal(s):  In progress.   Nutritional Diagnosis:  NB-1.1 Food and nutrition-related knowledge deficit As related to bone health.  As evidenced by multiple nutrition questions despite good understanding and comprehensive research for her condition .    Intervention:  Nutrition education.   Handouts given during visit include:  AVS  BEST (Bone, Estrogen, Strength Training trial handout on resistance exercise for osteoporosis prevention  Vegan food sources of calcium  Demonstrated degree of understanding via:  Teach Back  Barriers to learning/adherence to lifestyle change: Busy schedule, although Nida seems very  organized and plans meals and snacks as well as exercise in advance.    Monitoring/Evaluation:  Dietary intake, exercise, and body weight prn.

## 2016-07-11 NOTE — Patient Instructions (Addendum)
-   One dietary component often overlooked is produce (fruit and vegetables).  These will be especially important as you increase your protein b/c the F and V offset the additional acid provided by protein (especially animal).   - A rule of thumb for protein:Ca ratio is 1:20; for every gram of protein, get at least 20 mg of Ca (dietary or supplemental, with an effort to get as much from food as you can).   - Meeting your protein needs:  Aim for minimum of 56 (your weight in kg) grams per day.  Probably NOT necessary to get to 100 grams.  There are ~7 grams of protein in each oz of meat; 7 g in each egg.  A cup of most beans is equal to ~2 oz of meat.  (Consider a serving to be minimum of 1 full cup.  Combine beans with whole grains or even small amts of animal protein to increase total amt of protein.)  Nuts and seeds have more fat than protein, so are not a great source of protein.  (Pumpkin seeds are probably the best source among seeds and nuts.)  - Sports protein-lemonade drink:  Mix 2 T Pro-stat with 10 oz WF 365 lemonade, and dilute to 20-22 oz with water.  (Or just make sure you add ~30 g worth of carb in whatever fruit juice you use.)   - Best dairy milk substitute is soy b/c of its protein content.  If you use soy milk, be sure to SHAKE vigorously EACH time you use it, or you will leave the fortification at the bottom of the container.   - You might want to try a whey protein powder to see if you react to that.  (Whey remains the gold standard of protein supplements.)  - Water:  Remember that all fluids count, even coffee and beer.  Aim for 56 oz of water per day.  Start each day with 16 oz of water.    - Continue resistance exercises, and consult the handout provided today on the BEST trial for osteoporosis prevention.    - Call or email if you have Qs!

## 2016-07-11 NOTE — Progress Notes (Signed)
  Patient comes in today for custom orthotics. She has several questions regarding a recent MRI of her left hip which showed some mild degenerative changes. When compared to the MRI done last year (which showed a stress fracture through the femoral neck) she has definitely developed some mild osteoarthritis in the interim. She is questioning as to why that may be the case. She does have a history, albeit a remote history, of a variant of rheumatoid arthritis. This was diagnosed over 20 years ago [facility]. For the most part, her symptoms are manageable but she believes that she has started to get a flare secondary to starting bee venom shots to try to develop an immunity against bee stings. Since these injections do stimulate her immune system, this may in fact be the reason for her recent hip discomfort. Her treatment will be coming to an end soon so we will see if her pain resolves as she completes treatment. She understands that a rheumatology consultation can be ordered if her symptoms persist or worsen.  Patient was fitted for a : standard, cushioned, semi-rigid orthotic. The orthotic was heated and afterward the patient stood on the orthotic blank positioned on the orthotic stand. The patient was positioned in subtalar neutral position and 10 degrees of ankle dorsiflexion in a weight bearing stance. After completion of molding, a stable base was applied to the orthotic blank. The blank was ground to a stable position for weight bearing. Size: 8 Base: Blue EVA Posting: none Additional orthotic padding: none   Patient found her orthotics to be comfortable prior to leaving the office. A total of 40 minutes was spent with the patient with greater than 50% of the time spent in face-to-face consultation discussing her orthotic construction, instruction, and fitting as well as discussing her mild left hip osteoarthritis.

## 2016-08-21 DIAGNOSIS — H04129 Dry eye syndrome of unspecified lacrimal gland: Secondary | ICD-10-CM | POA: Diagnosis not present

## 2016-08-21 DIAGNOSIS — H5213 Myopia, bilateral: Secondary | ICD-10-CM | POA: Diagnosis not present

## 2016-08-29 DIAGNOSIS — T63451D Toxic effect of venom of hornets, accidental (unintentional), subsequent encounter: Secondary | ICD-10-CM | POA: Diagnosis not present

## 2016-08-29 DIAGNOSIS — T63461D Toxic effect of venom of wasps, accidental (unintentional), subsequent encounter: Secondary | ICD-10-CM | POA: Diagnosis not present

## 2016-08-29 DIAGNOSIS — Z91038 Other insect allergy status: Secondary | ICD-10-CM | POA: Diagnosis not present

## 2016-09-22 ENCOUNTER — Ambulatory Visit (INDEPENDENT_AMBULATORY_CARE_PROVIDER_SITE_OTHER): Payer: BLUE CROSS/BLUE SHIELD | Admitting: Family Medicine

## 2016-09-22 ENCOUNTER — Encounter: Payer: Self-pay | Admitting: Family Medicine

## 2016-09-22 VITALS — Ht 66.0 in | Wt 123.0 lb

## 2016-09-22 DIAGNOSIS — M25572 Pain in left ankle and joints of left foot: Secondary | ICD-10-CM

## 2016-09-24 NOTE — Progress Notes (Signed)
Patient returned for modification of her left orthotic as arch did not feel sufficient. We re-made the orthotic and it seemed to fit better. No charge for visit.

## 2016-09-26 DIAGNOSIS — T63451D Toxic effect of venom of hornets, accidental (unintentional), subsequent encounter: Secondary | ICD-10-CM | POA: Diagnosis not present

## 2016-09-26 DIAGNOSIS — T63461D Toxic effect of venom of wasps, accidental (unintentional), subsequent encounter: Secondary | ICD-10-CM | POA: Diagnosis not present

## 2016-09-26 DIAGNOSIS — Z91038 Other insect allergy status: Secondary | ICD-10-CM | POA: Diagnosis not present

## 2016-10-24 DIAGNOSIS — T63461D Toxic effect of venom of wasps, accidental (unintentional), subsequent encounter: Secondary | ICD-10-CM | POA: Diagnosis not present

## 2016-10-24 DIAGNOSIS — Z91038 Other insect allergy status: Secondary | ICD-10-CM | POA: Diagnosis not present

## 2016-10-24 DIAGNOSIS — T63451D Toxic effect of venom of hornets, accidental (unintentional), subsequent encounter: Secondary | ICD-10-CM | POA: Diagnosis not present

## 2016-11-14 DIAGNOSIS — Z1151 Encounter for screening for human papillomavirus (HPV): Secondary | ICD-10-CM | POA: Diagnosis not present

## 2016-11-14 DIAGNOSIS — Z01419 Encounter for gynecological examination (general) (routine) without abnormal findings: Secondary | ICD-10-CM | POA: Diagnosis not present

## 2016-11-14 DIAGNOSIS — Z124 Encounter for screening for malignant neoplasm of cervix: Secondary | ICD-10-CM | POA: Diagnosis not present

## 2016-11-14 DIAGNOSIS — M810 Age-related osteoporosis without current pathological fracture: Secondary | ICD-10-CM | POA: Diagnosis not present

## 2016-11-22 DIAGNOSIS — T63451D Toxic effect of venom of hornets, accidental (unintentional), subsequent encounter: Secondary | ICD-10-CM | POA: Diagnosis not present

## 2016-11-22 DIAGNOSIS — Z91038 Other insect allergy status: Secondary | ICD-10-CM | POA: Diagnosis not present

## 2016-11-22 DIAGNOSIS — T63461D Toxic effect of venom of wasps, accidental (unintentional), subsequent encounter: Secondary | ICD-10-CM | POA: Diagnosis not present

## 2016-12-19 DIAGNOSIS — Z91038 Other insect allergy status: Secondary | ICD-10-CM | POA: Diagnosis not present

## 2016-12-19 DIAGNOSIS — T63451D Toxic effect of venom of hornets, accidental (unintentional), subsequent encounter: Secondary | ICD-10-CM | POA: Diagnosis not present

## 2016-12-19 DIAGNOSIS — T63461D Toxic effect of venom of wasps, accidental (unintentional), subsequent encounter: Secondary | ICD-10-CM | POA: Diagnosis not present

## 2016-12-21 DIAGNOSIS — M810 Age-related osteoporosis without current pathological fracture: Secondary | ICD-10-CM | POA: Diagnosis not present

## 2016-12-21 DIAGNOSIS — Z780 Asymptomatic menopausal state: Secondary | ICD-10-CM | POA: Diagnosis not present

## 2016-12-21 DIAGNOSIS — Z13820 Encounter for screening for osteoporosis: Secondary | ICD-10-CM | POA: Diagnosis not present

## 2017-01-03 DIAGNOSIS — Z681 Body mass index (BMI) 19 or less, adult: Secondary | ICD-10-CM | POA: Diagnosis not present

## 2017-01-03 DIAGNOSIS — M810 Age-related osteoporosis without current pathological fracture: Secondary | ICD-10-CM | POA: Diagnosis not present

## 2017-01-03 DIAGNOSIS — Z713 Dietary counseling and surveillance: Secondary | ICD-10-CM | POA: Diagnosis not present

## 2017-01-04 DIAGNOSIS — T63451D Toxic effect of venom of hornets, accidental (unintentional), subsequent encounter: Secondary | ICD-10-CM | POA: Diagnosis not present

## 2017-01-04 DIAGNOSIS — T63461D Toxic effect of venom of wasps, accidental (unintentional), subsequent encounter: Secondary | ICD-10-CM | POA: Diagnosis not present

## 2017-01-23 DIAGNOSIS — T63451D Toxic effect of venom of hornets, accidental (unintentional), subsequent encounter: Secondary | ICD-10-CM | POA: Diagnosis not present

## 2017-01-23 DIAGNOSIS — T63461D Toxic effect of venom of wasps, accidental (unintentional), subsequent encounter: Secondary | ICD-10-CM | POA: Diagnosis not present

## 2017-01-23 DIAGNOSIS — Z91038 Other insect allergy status: Secondary | ICD-10-CM | POA: Diagnosis not present

## 2017-02-20 DIAGNOSIS — T63451D Toxic effect of venom of hornets, accidental (unintentional), subsequent encounter: Secondary | ICD-10-CM | POA: Diagnosis not present

## 2017-02-20 DIAGNOSIS — T63461D Toxic effect of venom of wasps, accidental (unintentional), subsequent encounter: Secondary | ICD-10-CM | POA: Diagnosis not present

## 2017-02-20 DIAGNOSIS — Z91038 Other insect allergy status: Secondary | ICD-10-CM | POA: Diagnosis not present

## 2017-03-27 DIAGNOSIS — Z91038 Other insect allergy status: Secondary | ICD-10-CM | POA: Diagnosis not present

## 2017-03-27 DIAGNOSIS — T63461D Toxic effect of venom of wasps, accidental (unintentional), subsequent encounter: Secondary | ICD-10-CM | POA: Diagnosis not present

## 2017-03-27 DIAGNOSIS — T63451D Toxic effect of venom of hornets, accidental (unintentional), subsequent encounter: Secondary | ICD-10-CM | POA: Diagnosis not present

## 2017-04-25 DIAGNOSIS — T63451D Toxic effect of venom of hornets, accidental (unintentional), subsequent encounter: Secondary | ICD-10-CM | POA: Diagnosis not present

## 2017-04-25 DIAGNOSIS — T63461D Toxic effect of venom of wasps, accidental (unintentional), subsequent encounter: Secondary | ICD-10-CM | POA: Diagnosis not present

## 2017-04-25 DIAGNOSIS — Z91038 Other insect allergy status: Secondary | ICD-10-CM | POA: Diagnosis not present

## 2017-05-22 DIAGNOSIS — Z91038 Other insect allergy status: Secondary | ICD-10-CM | POA: Diagnosis not present

## 2017-05-22 DIAGNOSIS — T63451D Toxic effect of venom of hornets, accidental (unintentional), subsequent encounter: Secondary | ICD-10-CM | POA: Diagnosis not present

## 2017-05-22 DIAGNOSIS — T63461D Toxic effect of venom of wasps, accidental (unintentional), subsequent encounter: Secondary | ICD-10-CM | POA: Diagnosis not present

## 2017-05-24 DIAGNOSIS — T63461D Toxic effect of venom of wasps, accidental (unintentional), subsequent encounter: Secondary | ICD-10-CM | POA: Diagnosis not present

## 2017-05-24 DIAGNOSIS — T63451D Toxic effect of venom of hornets, accidental (unintentional), subsequent encounter: Secondary | ICD-10-CM | POA: Diagnosis not present

## 2017-05-24 DIAGNOSIS — Z91038 Other insect allergy status: Secondary | ICD-10-CM | POA: Diagnosis not present

## 2017-07-10 DIAGNOSIS — T63461D Toxic effect of venom of wasps, accidental (unintentional), subsequent encounter: Secondary | ICD-10-CM | POA: Diagnosis not present

## 2017-07-10 DIAGNOSIS — Z91038 Other insect allergy status: Secondary | ICD-10-CM | POA: Diagnosis not present

## 2017-07-10 DIAGNOSIS — T63451D Toxic effect of venom of hornets, accidental (unintentional), subsequent encounter: Secondary | ICD-10-CM | POA: Diagnosis not present

## 2017-07-24 DIAGNOSIS — T63461D Toxic effect of venom of wasps, accidental (unintentional), subsequent encounter: Secondary | ICD-10-CM | POA: Diagnosis not present

## 2017-07-24 DIAGNOSIS — T63451D Toxic effect of venom of hornets, accidental (unintentional), subsequent encounter: Secondary | ICD-10-CM | POA: Diagnosis not present

## 2017-08-01 IMAGING — MR MR HIP*L* W/O CM
4 of 5 series · 16 of 40 positions shown · non-contrast
Comparison: Radiographs 05/30/2016

CLINICAL DATA: Acute left hip pain for 3 weeks.  No known injury.

EXAM:
MR OF THE LEFT HIP WITHOUT CONTRAST
TECHNIQUE: Multiplanar, multisequence MR imaging was performed. No intravenous
contrast was administered.

[Series 4: T1 · coronal · 4.0mm · 0.49mm/px · 3 of 34 slices shown (1 of 2)]
[im 4/34]
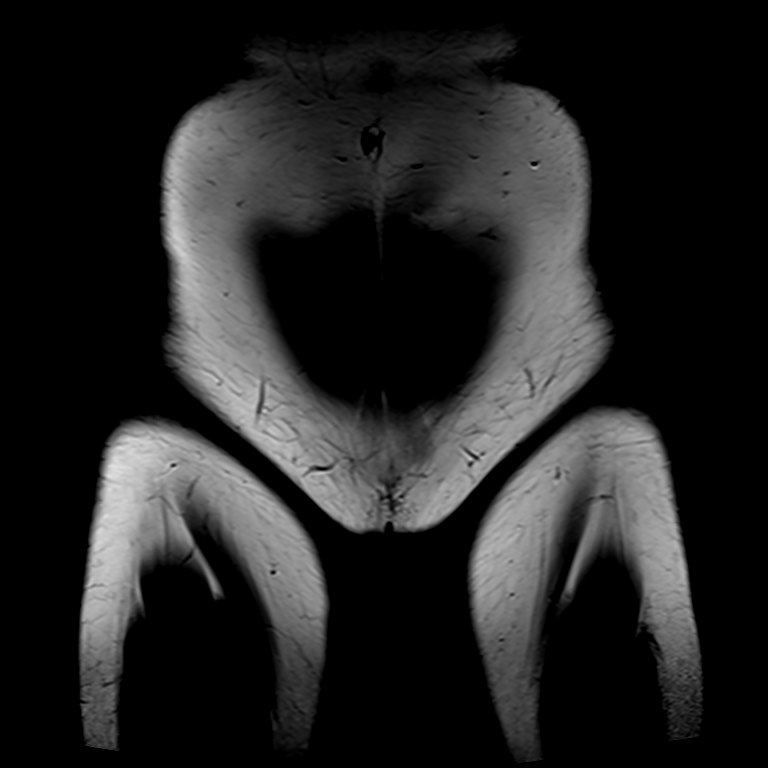
[im 19/34]
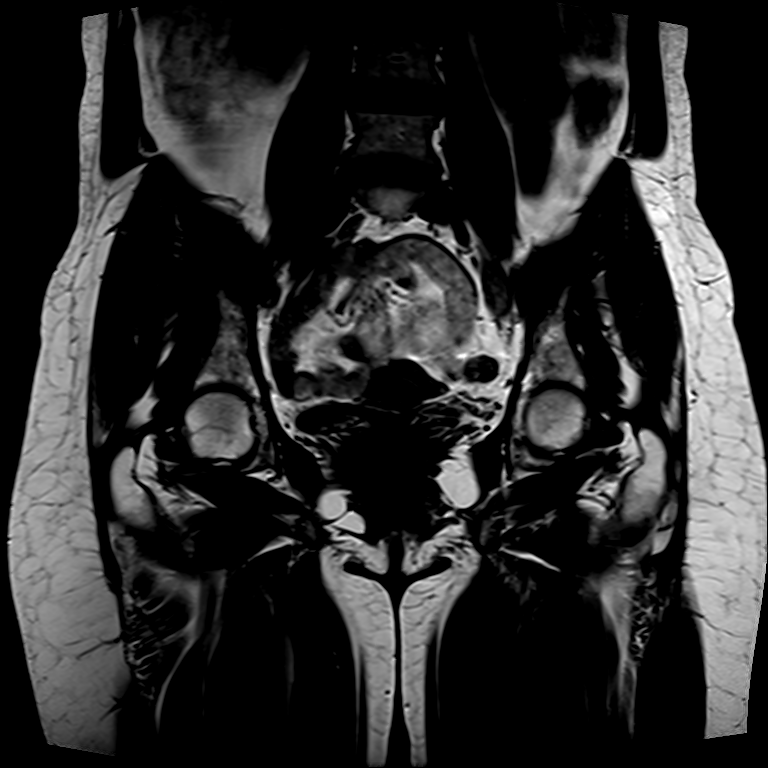
[im 30/34]
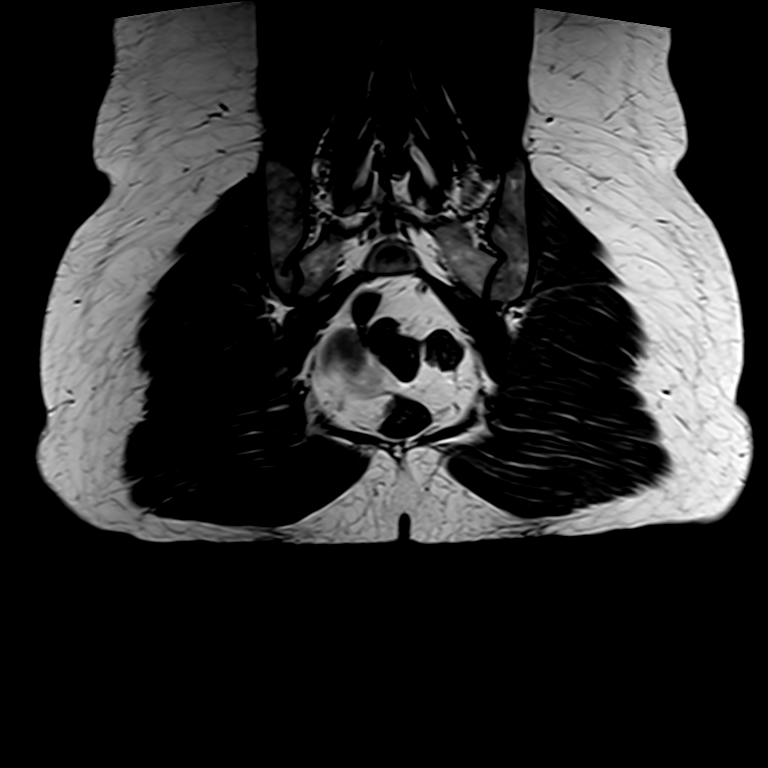

[Series 5: T2 fat-sat · axial · 4.0mm · 0.52mm/px · z∈[-121,-44]mm · 3 of 24 slices shown]
[im 4/24]
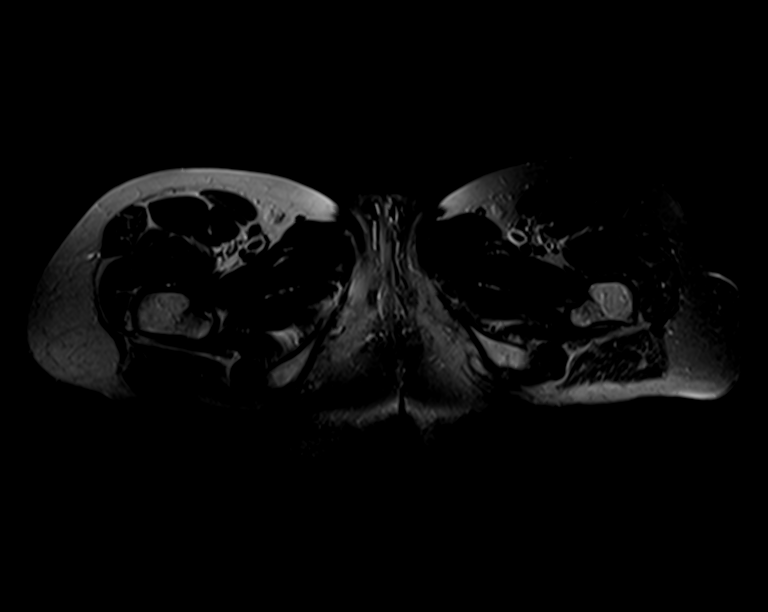
[im 12/24]
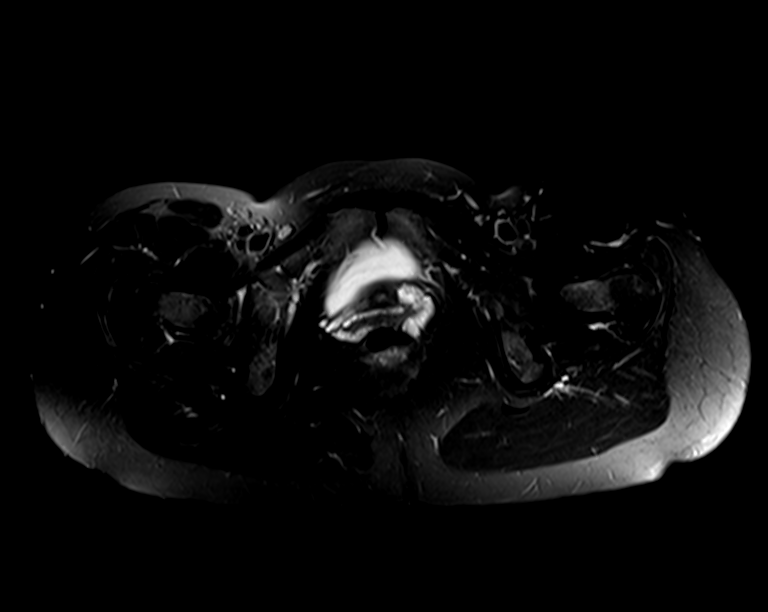
[im 20/24]
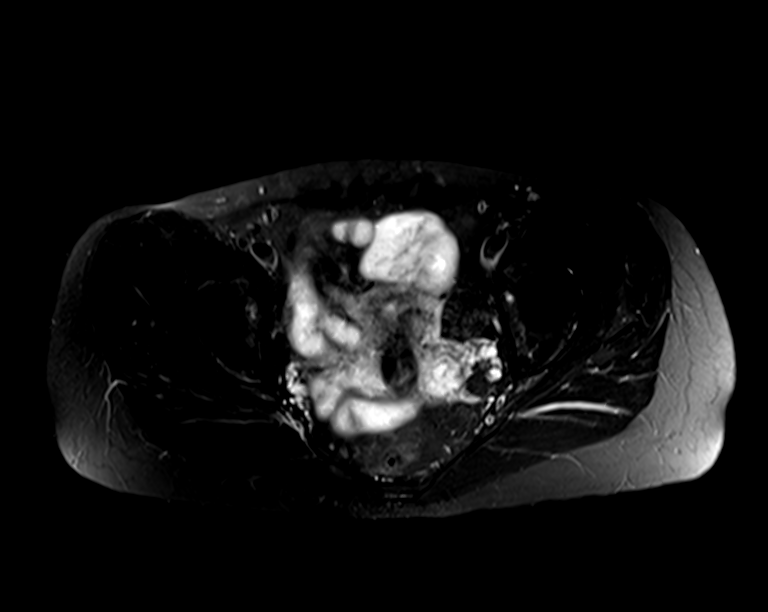

[Series 6: T1 · axial · 4.0mm · 0.49mm/px · z∈[-116,-39]mm · 3 of 20 slices shown (2 of 2)]
[im 4/20]
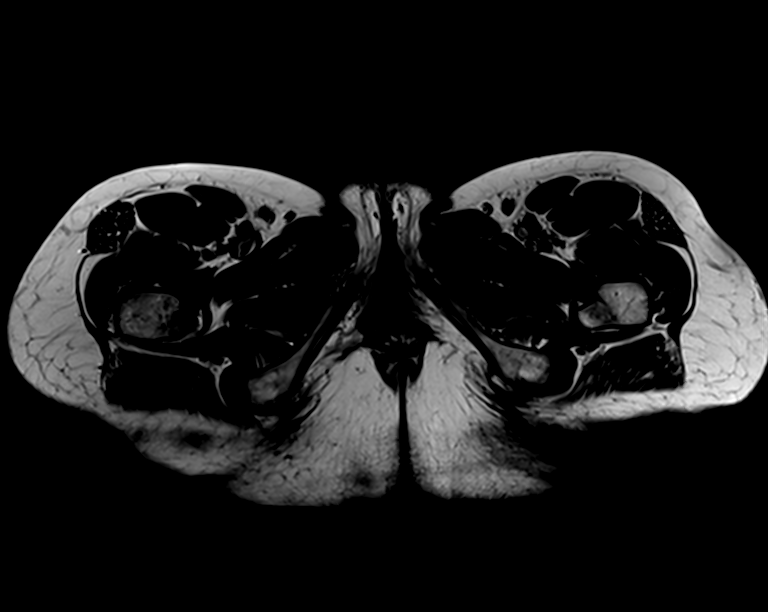
[im 12/20]
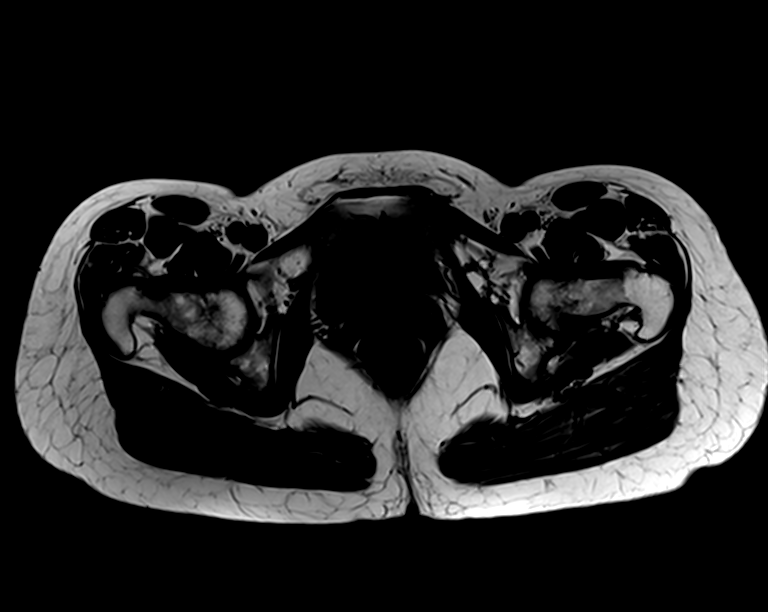
[im 20/20]
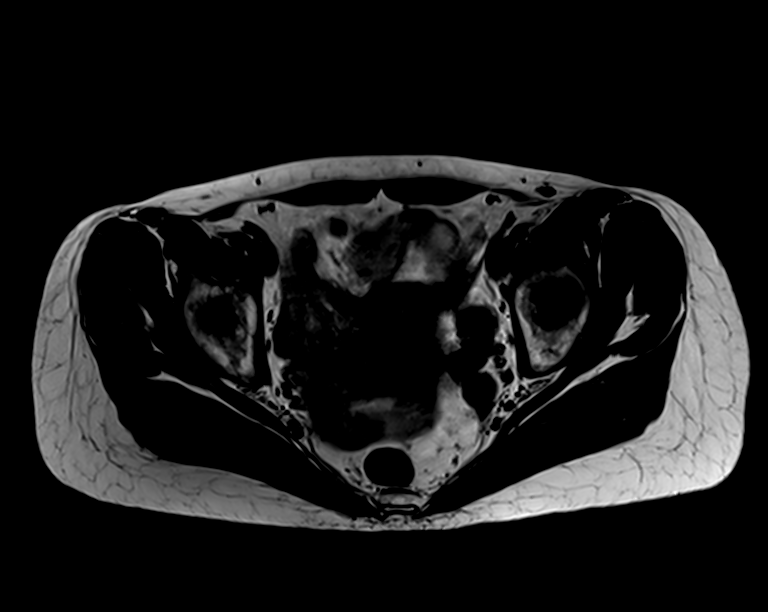

[Series 7: PD · sagittal · 4.0mm · 0.70mm/px · 7 of 22 slices shown]
[im 1/22]
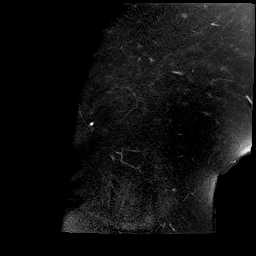
[im 4/22]
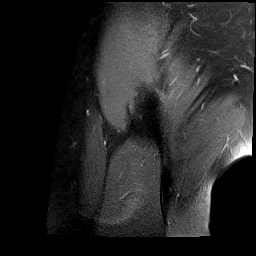
[im 8/22]
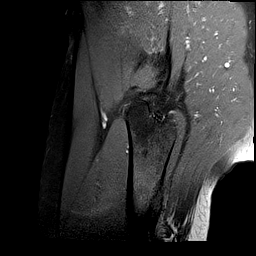
[im 11/22]
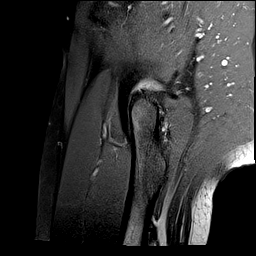
[im 15/22]
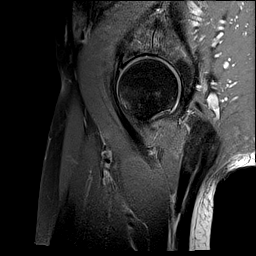
[im 18/22]
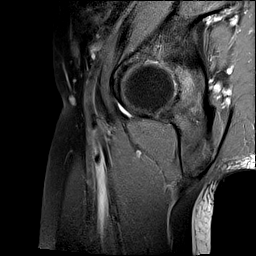
[im 22/22]
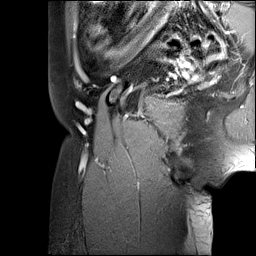

[16 of 40 positions shown; findings below may reference images not displayed]

FINDINGS: Bones: Both hips are normally located. Mild degenerative changes
bilaterally with early joint space narrowing. No stress fracture or
AVN. The pubic symphysis and SI joints are intact. No pelvic
fractures or bone lesions.

Articular cartilage and labrum

Articular cartilage: Mild, age advanced degenerative chondrosis but
no full-thickness cartilage defect.

Labrum:  No obvious labral tear.

Joint or bursal effusion

Joint effusion:  Small left hip joint effusion.

Bursae:  No findings for trochanteric bursitis.

Muscles and tendons

Muscles and tendons: Mild peritendinosis. The hip and pelvic
musculature appear normal. No muscle tear or myositis.

Other findings

Miscellaneous: No significant intrapelvic abnormalities are
identified. No inguinal mass or hernia.
IMPRESSION: 1. Mild/early hip joint degenerative changes bilaterally but no
stress fracture or AVN.
2. Small left hip joint effusion.  No definite erosions.
3. Normal appearance of the bony pelvis. Partial sacralization of
L1.

## 2017-08-21 DIAGNOSIS — Z91038 Other insect allergy status: Secondary | ICD-10-CM | POA: Diagnosis not present

## 2017-08-21 DIAGNOSIS — T63461D Toxic effect of venom of wasps, accidental (unintentional), subsequent encounter: Secondary | ICD-10-CM | POA: Diagnosis not present

## 2017-08-21 DIAGNOSIS — T63451D Toxic effect of venom of hornets, accidental (unintentional), subsequent encounter: Secondary | ICD-10-CM | POA: Diagnosis not present

## 2017-11-14 DIAGNOSIS — Z91038 Other insect allergy status: Secondary | ICD-10-CM | POA: Diagnosis not present

## 2017-11-14 DIAGNOSIS — T63451D Toxic effect of venom of hornets, accidental (unintentional), subsequent encounter: Secondary | ICD-10-CM | POA: Diagnosis not present

## 2017-11-14 DIAGNOSIS — M25571 Pain in right ankle and joints of right foot: Secondary | ICD-10-CM | POA: Diagnosis not present

## 2017-11-14 DIAGNOSIS — S91301A Unspecified open wound, right foot, initial encounter: Secondary | ICD-10-CM | POA: Diagnosis not present

## 2017-11-14 DIAGNOSIS — S99911A Unspecified injury of right ankle, initial encounter: Secondary | ICD-10-CM | POA: Diagnosis not present

## 2017-11-14 DIAGNOSIS — T63461D Toxic effect of venom of wasps, accidental (unintentional), subsequent encounter: Secondary | ICD-10-CM | POA: Diagnosis not present

## 2017-11-15 ENCOUNTER — Ambulatory Visit (INDEPENDENT_AMBULATORY_CARE_PROVIDER_SITE_OTHER): Payer: BLUE CROSS/BLUE SHIELD | Admitting: Sports Medicine

## 2017-11-15 ENCOUNTER — Encounter: Payer: Self-pay | Admitting: Sports Medicine

## 2017-11-15 DIAGNOSIS — S92215A Nondisplaced fracture of cuboid bone of left foot, initial encounter for closed fracture: Secondary | ICD-10-CM | POA: Diagnosis not present

## 2017-11-15 DIAGNOSIS — T148XXA Other injury of unspecified body region, initial encounter: Secondary | ICD-10-CM | POA: Diagnosis not present

## 2017-11-15 DIAGNOSIS — S92212A Displaced fracture of cuboid bone of left foot, initial encounter for closed fracture: Secondary | ICD-10-CM | POA: Insufficient documentation

## 2017-11-15 NOTE — Progress Notes (Signed)
Chief complaint left foot fracture  2 weeks ago patient turned her ankle on a 34 mile run This was at mild but she finished the run She was seen in [place] and had an x-ray of[place]win her foot The x-ray revealed a small avulsion from her cuboid She has been wearing a hiking boot but did not like the ankle sleeve  She comes for my opinion as she is anxious to run a race in about 10 days  Review of systems Mild pain with walking Swelling has resolved No numbness  Past medical history Osteopenia responding to exercise and calcium supplementation She has seen Lyla GlassingJamie Sykes for nutrition advice  Physical examination Pleasant thin female in no acute distress BP (!) 151/62   Ht 5\' 6"  (1.676 m)   Wt 125 lb (56.7 kg)   BMI 20.18 kg/m   Left foot reveals tenderness to palpation isolated to the cuboid No significant swelling Ankle examination is stable Inversion causes pain at the cuboid Review of x-ray shows a very small calcaneal cuboidal avulsion fracture  After placement of a lace up ankle brace she was able to run without significant limp

## 2017-11-15 NOTE — Assessment & Plan Note (Signed)
She is making good progress  Wear a lace up ankle brace for activity for the next 3-4 weeks Wear supportive hiking boots when walking  She needs to moderate her running and activity for at least the next month She probably can run the 5K race as long as she uses her brace and does not get worsening pain

## 2017-11-19 ENCOUNTER — Encounter: Payer: Self-pay | Admitting: Sports Medicine

## 2017-11-21 DIAGNOSIS — M810 Age-related osteoporosis without current pathological fracture: Secondary | ICD-10-CM | POA: Diagnosis not present

## 2017-11-21 DIAGNOSIS — E892 Postprocedural hypoparathyroidism: Secondary | ICD-10-CM | POA: Diagnosis not present

## 2017-11-21 DIAGNOSIS — Z124 Encounter for screening for malignant neoplasm of cervix: Secondary | ICD-10-CM | POA: Diagnosis not present

## 2017-11-21 DIAGNOSIS — Z1151 Encounter for screening for human papillomavirus (HPV): Secondary | ICD-10-CM | POA: Diagnosis not present

## 2017-11-21 DIAGNOSIS — Z01419 Encounter for gynecological examination (general) (routine) without abnormal findings: Secondary | ICD-10-CM | POA: Diagnosis not present

## 2017-12-03 DIAGNOSIS — B079 Viral wart, unspecified: Secondary | ICD-10-CM | POA: Diagnosis not present

## 2017-12-25 DIAGNOSIS — Z91038 Other insect allergy status: Secondary | ICD-10-CM | POA: Diagnosis not present

## 2017-12-25 DIAGNOSIS — T63451D Toxic effect of venom of hornets, accidental (unintentional), subsequent encounter: Secondary | ICD-10-CM | POA: Diagnosis not present

## 2017-12-25 DIAGNOSIS — T63461D Toxic effect of venom of wasps, accidental (unintentional), subsequent encounter: Secondary | ICD-10-CM | POA: Diagnosis not present

## 2018-02-12 DIAGNOSIS — Z91038 Other insect allergy status: Secondary | ICD-10-CM | POA: Diagnosis not present

## 2018-02-12 DIAGNOSIS — T63461D Toxic effect of venom of wasps, accidental (unintentional), subsequent encounter: Secondary | ICD-10-CM | POA: Diagnosis not present

## 2018-02-12 DIAGNOSIS — T63451D Toxic effect of venom of hornets, accidental (unintentional), subsequent encounter: Secondary | ICD-10-CM | POA: Diagnosis not present

## 2018-02-23 DIAGNOSIS — T63451D Toxic effect of venom of hornets, accidental (unintentional), subsequent encounter: Secondary | ICD-10-CM | POA: Diagnosis not present

## 2018-02-23 DIAGNOSIS — T63461D Toxic effect of venom of wasps, accidental (unintentional), subsequent encounter: Secondary | ICD-10-CM | POA: Diagnosis not present

## 2018-03-27 DIAGNOSIS — T63451D Toxic effect of venom of hornets, accidental (unintentional), subsequent encounter: Secondary | ICD-10-CM | POA: Diagnosis not present

## 2018-03-27 DIAGNOSIS — Z91038 Other insect allergy status: Secondary | ICD-10-CM | POA: Diagnosis not present

## 2018-03-27 DIAGNOSIS — T63461D Toxic effect of venom of wasps, accidental (unintentional), subsequent encounter: Secondary | ICD-10-CM | POA: Diagnosis not present

## 2018-04-11 DIAGNOSIS — B079 Viral wart, unspecified: Secondary | ICD-10-CM | POA: Diagnosis not present

## 2018-04-15 DIAGNOSIS — R799 Abnormal finding of blood chemistry, unspecified: Secondary | ICD-10-CM | POA: Diagnosis not present

## 2018-05-08 DIAGNOSIS — T63461D Toxic effect of venom of wasps, accidental (unintentional), subsequent encounter: Secondary | ICD-10-CM | POA: Diagnosis not present

## 2018-05-08 DIAGNOSIS — T63451D Toxic effect of venom of hornets, accidental (unintentional), subsequent encounter: Secondary | ICD-10-CM | POA: Diagnosis not present

## 2019-05-08 ENCOUNTER — Other Ambulatory Visit: Payer: Self-pay

## 2019-05-08 ENCOUNTER — Ambulatory Visit (INDEPENDENT_AMBULATORY_CARE_PROVIDER_SITE_OTHER): Payer: BC Managed Care – PPO | Admitting: Sports Medicine

## 2019-05-08 ENCOUNTER — Encounter: Payer: Self-pay | Admitting: Sports Medicine

## 2019-05-08 VITALS — BP 118/82 | Ht 66.0 in | Wt 127.0 lb

## 2019-05-08 DIAGNOSIS — M25579 Pain in unspecified ankle and joints of unspecified foot: Secondary | ICD-10-CM | POA: Diagnosis not present

## 2019-05-08 NOTE — Progress Notes (Signed)
  Jacqueline Michael comes in today for new custom orthotics.  She has had some custom orthotics in the past.  They are few years old.  She is experiencing some returning arch pain.  New custom orthotics were created for her today.  She found the right orthotic to be comfortable.  She does not feel like the left orthotic had enough arch support so we tried a scaphoid pad.  This was not comfortable so we adjusted the orthotic itself.  If she continues to struggle with this then she will return to the office and we could construct a new orthotic if needed.  Patient was fitted for a : standard, cushioned, semi-rigid orthotic. The orthotic was heated and afterward the patient stood on the orthotic blank positioned on the orthotic stand. The patient was positioned in subtalar neutral position and 10 degrees of ankle dorsiflexion in a weight bearing stance. After completion of molding, a stable base was applied to the orthotic blank. The blank was ground to a stable position for weight bearing. Size: 8 Base: Blue EVA Posting: none Additional orthotic padding: none

## 2019-07-08 DIAGNOSIS — B079 Viral wart, unspecified: Secondary | ICD-10-CM | POA: Diagnosis not present

## 2019-08-12 DIAGNOSIS — T63451D Toxic effect of venom of hornets, accidental (unintentional), subsequent encounter: Secondary | ICD-10-CM | POA: Diagnosis not present

## 2019-08-12 DIAGNOSIS — T63461D Toxic effect of venom of wasps, accidental (unintentional), subsequent encounter: Secondary | ICD-10-CM | POA: Diagnosis not present

## 2019-08-26 DIAGNOSIS — T63461D Toxic effect of venom of wasps, accidental (unintentional), subsequent encounter: Secondary | ICD-10-CM | POA: Diagnosis not present

## 2019-08-26 DIAGNOSIS — T63451D Toxic effect of venom of hornets, accidental (unintentional), subsequent encounter: Secondary | ICD-10-CM | POA: Diagnosis not present

## 2019-09-10 DIAGNOSIS — F4322 Adjustment disorder with anxiety: Secondary | ICD-10-CM | POA: Diagnosis not present

## 2019-10-15 DIAGNOSIS — F4322 Adjustment disorder with anxiety: Secondary | ICD-10-CM | POA: Diagnosis not present

## 2019-10-21 DIAGNOSIS — T63451D Toxic effect of venom of hornets, accidental (unintentional), subsequent encounter: Secondary | ICD-10-CM | POA: Diagnosis not present

## 2019-10-21 DIAGNOSIS — T63461D Toxic effect of venom of wasps, accidental (unintentional), subsequent encounter: Secondary | ICD-10-CM | POA: Diagnosis not present

## 2019-10-29 DIAGNOSIS — F4322 Adjustment disorder with anxiety: Secondary | ICD-10-CM | POA: Diagnosis not present

## 2019-11-03 DIAGNOSIS — Z0000 Encounter for general adult medical examination without abnormal findings: Secondary | ICD-10-CM | POA: Diagnosis not present

## 2019-11-03 DIAGNOSIS — Z6821 Body mass index (BMI) 21.0-21.9, adult: Secondary | ICD-10-CM | POA: Diagnosis not present

## 2019-11-05 DIAGNOSIS — E892 Postprocedural hypoparathyroidism: Secondary | ICD-10-CM | POA: Diagnosis not present

## 2019-11-05 DIAGNOSIS — Z13220 Encounter for screening for lipoid disorders: Secondary | ICD-10-CM | POA: Diagnosis not present

## 2019-11-05 DIAGNOSIS — Z79899 Other long term (current) drug therapy: Secondary | ICD-10-CM | POA: Diagnosis not present

## 2019-11-05 DIAGNOSIS — Z0000 Encounter for general adult medical examination without abnormal findings: Secondary | ICD-10-CM | POA: Diagnosis not present

## 2019-11-11 DIAGNOSIS — T63461D Toxic effect of venom of wasps, accidental (unintentional), subsequent encounter: Secondary | ICD-10-CM | POA: Diagnosis not present

## 2019-11-11 DIAGNOSIS — T63451D Toxic effect of venom of hornets, accidental (unintentional), subsequent encounter: Secondary | ICD-10-CM | POA: Diagnosis not present

## 2019-11-12 DIAGNOSIS — F4322 Adjustment disorder with anxiety: Secondary | ICD-10-CM | POA: Diagnosis not present

## 2019-11-26 DIAGNOSIS — F4322 Adjustment disorder with anxiety: Secondary | ICD-10-CM | POA: Diagnosis not present

## 2019-12-09 DIAGNOSIS — Z1231 Encounter for screening mammogram for malignant neoplasm of breast: Secondary | ICD-10-CM | POA: Diagnosis not present

## 2019-12-16 DIAGNOSIS — T63461D Toxic effect of venom of wasps, accidental (unintentional), subsequent encounter: Secondary | ICD-10-CM | POA: Diagnosis not present

## 2019-12-16 DIAGNOSIS — T63451D Toxic effect of venom of hornets, accidental (unintentional), subsequent encounter: Secondary | ICD-10-CM | POA: Diagnosis not present

## 2019-12-17 DIAGNOSIS — F4322 Adjustment disorder with anxiety: Secondary | ICD-10-CM | POA: Diagnosis not present

## 2019-12-23 ENCOUNTER — Other Ambulatory Visit: Payer: Self-pay

## 2019-12-23 ENCOUNTER — Ambulatory Visit (INDEPENDENT_AMBULATORY_CARE_PROVIDER_SITE_OTHER): Payer: BC Managed Care – PPO | Admitting: Sports Medicine

## 2019-12-23 VITALS — BP 110/82 | Ht 66.0 in | Wt 127.0 lb

## 2019-12-23 DIAGNOSIS — R109 Unspecified abdominal pain: Secondary | ICD-10-CM | POA: Diagnosis not present

## 2019-12-23 NOTE — Progress Notes (Signed)
    SUBJECTIVE:   CHIEF COMPLAINT / HPI:   Jacqueline Michael is a 49 y.o. female who presents with 4 weeks of right lateral abdominal pain.  Patient was working out with [employer] while working on her obliques and strained her muscle.  She said that it got swollen and puffy.  Initially took ibuprofen for pain relieve.  She said it affects daily activities such as walking, rolling over in bed, sneezing.  She took some time off of physical activity to let it heal.  It is improved now, such that she does not have regular activities affected.  But, whenever she runs or does brisk walking she gets the pain again.  After about 30 minutes of activity, the pain continues to get worse to where she has to stop.  Patient no longer has to take ibuprofen for pain relieve.  She is interested in core strengthening exercises to help prevent future injury.    OBJECTIVE:   BP 110/82   Ht 5\' 6"  (1.676 m)   Wt 127 lb (57.6 kg)   BMI 20.50 kg/m    Patient has tenderness to palpation along the right 10th rib as well as inferior to this along the area the obliques.  No palpable defect.  Reproducible pain with oblique contraction.  Negative Murphy's.   ASSESSMENT/PLAN:   Abdominal wall pain versus fractured rib Concern for pathologic fracture given patient's history of osteoporosis.  Given improvement thus far, would recommend continued conservative management.  If does not resolve in another 4 weeks, recommend return in would get imaging such as rib series   , MD [facility]   Patient seen and evaluated with the resident.  I agree with the above plan of care.  Although oblique strain is possible, there is also the possibility of a stress fracture in the lower ribs.  Patient has a known history of osteoporosis in her length of symptoms could be explained by fracture.  We did discuss x-rays but decided to wait on that for now since the treatment for a nondisplaced rib  fracture would be the same as for an oblique strain.  Her symptoms are improving although she is certainly not able to run the way that she would like.  I would look for her symptoms to continue to improve over the next 4 weeks to the point of complete resolution.  If that is not the case, she will contact me and I will reconsider rib x-rays at that time.  Follow-up for ongoing or recalcitrant issues.

## 2019-12-24 DIAGNOSIS — F4322 Adjustment disorder with anxiety: Secondary | ICD-10-CM | POA: Diagnosis not present

## 2020-02-04 DIAGNOSIS — F4322 Adjustment disorder with anxiety: Secondary | ICD-10-CM | POA: Diagnosis not present

## 2020-02-10 DIAGNOSIS — T63461D Toxic effect of venom of wasps, accidental (unintentional), subsequent encounter: Secondary | ICD-10-CM | POA: Diagnosis not present

## 2020-02-10 DIAGNOSIS — T63451D Toxic effect of venom of hornets, accidental (unintentional), subsequent encounter: Secondary | ICD-10-CM | POA: Diagnosis not present

## 2020-02-18 DIAGNOSIS — F4322 Adjustment disorder with anxiety: Secondary | ICD-10-CM | POA: Diagnosis not present

## 2020-03-03 DIAGNOSIS — F4322 Adjustment disorder with anxiety: Secondary | ICD-10-CM | POA: Diagnosis not present

## 2020-03-16 DIAGNOSIS — F4322 Adjustment disorder with anxiety: Secondary | ICD-10-CM | POA: Diagnosis not present

## 2020-03-31 DIAGNOSIS — F4322 Adjustment disorder with anxiety: Secondary | ICD-10-CM | POA: Diagnosis not present

## 2020-04-08 DIAGNOSIS — T63461D Toxic effect of venom of wasps, accidental (unintentional), subsequent encounter: Secondary | ICD-10-CM | POA: Diagnosis not present

## 2020-04-08 DIAGNOSIS — T63451D Toxic effect of venom of hornets, accidental (unintentional), subsequent encounter: Secondary | ICD-10-CM | POA: Diagnosis not present

## 2020-04-14 DIAGNOSIS — F4322 Adjustment disorder with anxiety: Secondary | ICD-10-CM | POA: Diagnosis not present

## 2020-04-28 DIAGNOSIS — F4322 Adjustment disorder with anxiety: Secondary | ICD-10-CM | POA: Diagnosis not present

## 2020-05-11 DIAGNOSIS — F4322 Adjustment disorder with anxiety: Secondary | ICD-10-CM | POA: Diagnosis not present

## 2020-06-03 DIAGNOSIS — T63461D Toxic effect of venom of wasps, accidental (unintentional), subsequent encounter: Secondary | ICD-10-CM | POA: Diagnosis not present

## 2020-06-03 DIAGNOSIS — T63451D Toxic effect of venom of hornets, accidental (unintentional), subsequent encounter: Secondary | ICD-10-CM | POA: Diagnosis not present

## 2020-06-08 DIAGNOSIS — L500 Allergic urticaria: Secondary | ICD-10-CM | POA: Diagnosis not present

## 2020-06-08 DIAGNOSIS — Z91038 Other insect allergy status: Secondary | ICD-10-CM | POA: Diagnosis not present

## 2020-06-08 DIAGNOSIS — T63451D Toxic effect of venom of hornets, accidental (unintentional), subsequent encounter: Secondary | ICD-10-CM | POA: Diagnosis not present

## 2020-06-08 DIAGNOSIS — T63461D Toxic effect of venom of wasps, accidental (unintentional), subsequent encounter: Secondary | ICD-10-CM | POA: Diagnosis not present

## 2020-06-09 DIAGNOSIS — F4322 Adjustment disorder with anxiety: Secondary | ICD-10-CM | POA: Diagnosis not present

## 2020-06-22 DIAGNOSIS — R143 Flatulence: Secondary | ICD-10-CM | POA: Diagnosis not present

## 2020-06-22 DIAGNOSIS — Z6820 Body mass index (BMI) 20.0-20.9, adult: Secondary | ICD-10-CM | POA: Diagnosis not present

## 2020-07-27 DIAGNOSIS — T63461D Toxic effect of venom of wasps, accidental (unintentional), subsequent encounter: Secondary | ICD-10-CM | POA: Diagnosis not present

## 2020-07-27 DIAGNOSIS — T63451D Toxic effect of venom of hornets, accidental (unintentional), subsequent encounter: Secondary | ICD-10-CM | POA: Diagnosis not present

## 2020-08-18 DIAGNOSIS — T63451D Toxic effect of venom of hornets, accidental (unintentional), subsequent encounter: Secondary | ICD-10-CM | POA: Diagnosis not present

## 2020-08-18 DIAGNOSIS — F4322 Adjustment disorder with anxiety: Secondary | ICD-10-CM | POA: Diagnosis not present

## 2020-08-18 DIAGNOSIS — T63461D Toxic effect of venom of wasps, accidental (unintentional), subsequent encounter: Secondary | ICD-10-CM | POA: Diagnosis not present

## 2020-09-13 DIAGNOSIS — R140 Abdominal distension (gaseous): Secondary | ICD-10-CM | POA: Diagnosis not present

## 2020-09-15 DIAGNOSIS — F4322 Adjustment disorder with anxiety: Secondary | ICD-10-CM | POA: Diagnosis not present

## 2020-09-21 DIAGNOSIS — T63461D Toxic effect of venom of wasps, accidental (unintentional), subsequent encounter: Secondary | ICD-10-CM | POA: Diagnosis not present

## 2020-09-21 DIAGNOSIS — T63451D Toxic effect of venom of hornets, accidental (unintentional), subsequent encounter: Secondary | ICD-10-CM | POA: Diagnosis not present

## 2020-10-20 DIAGNOSIS — F4322 Adjustment disorder with anxiety: Secondary | ICD-10-CM | POA: Diagnosis not present

## 2020-11-17 DIAGNOSIS — F4322 Adjustment disorder with anxiety: Secondary | ICD-10-CM | POA: Diagnosis not present

## 2020-11-18 DIAGNOSIS — T63461D Toxic effect of venom of wasps, accidental (unintentional), subsequent encounter: Secondary | ICD-10-CM | POA: Diagnosis not present

## 2020-11-18 DIAGNOSIS — T63451D Toxic effect of venom of hornets, accidental (unintentional), subsequent encounter: Secondary | ICD-10-CM | POA: Diagnosis not present

## 2020-12-01 DIAGNOSIS — Z124 Encounter for screening for malignant neoplasm of cervix: Secondary | ICD-10-CM | POA: Diagnosis not present

## 2020-12-01 DIAGNOSIS — N912 Amenorrhea, unspecified: Secondary | ICD-10-CM | POA: Diagnosis not present

## 2020-12-01 DIAGNOSIS — Z0000 Encounter for general adult medical examination without abnormal findings: Secondary | ICD-10-CM | POA: Diagnosis not present

## 2020-12-01 DIAGNOSIS — Z79899 Other long term (current) drug therapy: Secondary | ICD-10-CM | POA: Diagnosis not present

## 2020-12-01 DIAGNOSIS — E892 Postprocedural hypoparathyroidism: Secondary | ICD-10-CM | POA: Diagnosis not present

## 2020-12-01 DIAGNOSIS — Z6821 Body mass index (BMI) 21.0-21.9, adult: Secondary | ICD-10-CM | POA: Diagnosis not present

## 2020-12-01 DIAGNOSIS — Z13220 Encounter for screening for lipoid disorders: Secondary | ICD-10-CM | POA: Diagnosis not present

## 2020-12-02 DIAGNOSIS — W57XXXA Bitten or stung by nonvenomous insect and other nonvenomous arthropods, initial encounter: Secondary | ICD-10-CM | POA: Diagnosis not present

## 2020-12-02 DIAGNOSIS — R21 Rash and other nonspecific skin eruption: Secondary | ICD-10-CM | POA: Diagnosis not present

## 2020-12-02 DIAGNOSIS — R5383 Other fatigue: Secondary | ICD-10-CM | POA: Diagnosis not present

## 2021-01-04 ENCOUNTER — Other Ambulatory Visit: Payer: Self-pay

## 2021-01-04 ENCOUNTER — Ambulatory Visit
Admission: RE | Admit: 2021-01-04 | Discharge: 2021-01-04 | Disposition: A | Discharge status: Home / Self Care | Payer: BC Managed Care – PPO | Source: Ambulatory Visit | Attending: Sports Medicine | Admitting: Sports Medicine

## 2021-01-04 ENCOUNTER — Ambulatory Visit (INDEPENDENT_AMBULATORY_CARE_PROVIDER_SITE_OTHER): Payer: BC Managed Care – PPO | Admitting: Sports Medicine

## 2021-01-04 VITALS — BP 124/70 | Ht 66.0 in | Wt 127.0 lb

## 2021-01-04 DIAGNOSIS — M25561 Pain in right knee: Secondary | ICD-10-CM

## 2021-01-04 DIAGNOSIS — M79661 Pain in right lower leg: Secondary | ICD-10-CM

## 2021-01-04 DIAGNOSIS — S8991XA Unspecified injury of right lower leg, initial encounter: Secondary | ICD-10-CM | POA: Diagnosis not present

## 2021-01-04 NOTE — Progress Notes (Signed)
    SUBJECTIVE:   CHIEF COMPLAINT / HPI:   Right Knee and Calf Pain Ms. Jacqueline Michael is a very pleasant 50 year old female who presents today for right knee and right calf pain.  This occurred first with an injury to the lateral side of the right knee when she missed a step on a family vacation out in [place].  She subsequently had pain and difficulty walking due to the pain on the outside of the right knee but could bear weight and continued walking and hiking over the vacation.  About 7 days after that she once again reinjured the right leg this time on the lower inside portion over the gastrocnemius area.  She had bruising and swelling and after that injury she could no longer bear weight and was on crutches.  It hurts to point her toe down.  She denies any catching or locking or giving out of the right knee.  She denies any other trauma or falls.  PERTINENT  PMH / PSH: None  OBJECTIVE:   BP 124/70   Ht 5\' 6"  (1.676 m)   Wt 127 lb (57.6 kg)   BMI 20.50 kg/m   No flowsheet data found.  Knee, Right: Inspection was negative for erythema, ecchymosis, and positive for mild effusion over lateral joint line. Small area of ecchymosis over distal medial gastrocnemius. No obvious bony abnormalities or signs of osteophyte development. Palpation yielded no asymmetric warmth; No patellar tenderness; No patellar crepitus. Patellar and quadriceps tendons unremarkable, and no tenderness of the pes anserine bursa. No obvious Baker's cyst development. ROM normal in flexion (135 degrees) and extension (0 degrees). Normal hamstring and quadriceps strength. Neurovascularly intact bilaterally. Special Tests  - Cruciate Ligaments:   - Anterior Drawer:  NEG - Posterior Drawer: NEG   - Lachman:  NEG  - Collateral Ligaments:   - Varus/Valgus Stress test: NEG but pain with valgus stress  - Meniscus:   - Thessaly: NEG   - McMurray's: NEG  - Patella:   - Patellar grind/compression: NEG   - Patellar glide:  Without apprehension  Limited MSK ultrasound: Right knee and right medial gastroc Some mild swelling just inferior to the lateral collateral ligament on the right knee.  LCL intact. No obvious meniscal tear on the lateral or medial joint lines visualized.  No swelling in the suprapatellar pouch.  Medial gastroc was well visualized no obvious tearing no hematomas present. Impression: Mild swelling inferior to lateral collateral ligament.    ASSESSMENT/PLAN:   Right knee pain Patient with likely medial gastrocnemius tear/strain but not complete. She can continue on crutches as needed until pain allows her to walk without a limp. - Once she can walk without a limp begin walking as discussed (10 min per day x 3) - Start heel raises as pain allows 15 reps 3 sets, 3 times per week - Tib/Fib x-rays obtained today, negative for a fracture at the fibular head, will await radiologist read and contact patient - Calf compression sleeve, heel lifts - F/u in 2 weeks     , DO PGY-4, Sports Medicine Fellow [facility]  Patient seen and evaluated with the sports medicine fellow.  I agree with the above plan of care.  X-rays are negative for fracture.  Treatment as above and follow-up in 2 weeks.  Hopefully we can advance her rehab per the up-to-date calf strain protocol at that time.  She has a big trip race planned in approximately 3 months.

## 2021-01-04 NOTE — Assessment & Plan Note (Signed)
Patient with likely medial gastrocnemius tear/strain but not complete. She can continue on crutches as needed until pain allows her to walk without a limp. - Once she can walk without a limp begin walking as discussed (10 min per day x 3) - Start heel raises as pain allows 15 reps 3 sets, 3 times per week - Tib/Fib x-rays obtained today, negative for a fracture at the fibular head, will await radiologist read and contact patient - Calf compression sleeve, heel lifts - F/u in 2 weeks

## 2021-01-04 NOTE — Patient Instructions (Addendum)
It was great to meet you today! Thank you for letting me participate in your care!  Today, we discussed your right knee and calf injury. Please wear the compression sleeve while active. You can continue using crutches as needed but should come off of them as soon as pain allows. Once you can walk WITHOUT a limp you can begin walking 10 min three times per day. Once you complete this you can do heel raises working your way up to 15 reps for 3 sets 3 times per week.   Please use ice, rest, elevation, and Voltaren gel as needed. We will see you back in 2 weeks.  Be well, Jules Schick, DO PGY-4, Sports Medicine Fellow [facility]

## 2021-01-11 DIAGNOSIS — T63451D Toxic effect of venom of hornets, accidental (unintentional), subsequent encounter: Secondary | ICD-10-CM | POA: Diagnosis not present

## 2021-01-11 DIAGNOSIS — T63461D Toxic effect of venom of wasps, accidental (unintentional), subsequent encounter: Secondary | ICD-10-CM | POA: Diagnosis not present

## 2021-01-17 DIAGNOSIS — F4322 Adjustment disorder with anxiety: Secondary | ICD-10-CM | POA: Diagnosis not present

## 2021-01-18 ENCOUNTER — Encounter: Payer: Self-pay | Admitting: Sports Medicine

## 2021-01-18 ENCOUNTER — Other Ambulatory Visit: Payer: Self-pay

## 2021-01-18 ENCOUNTER — Ambulatory Visit (INDEPENDENT_AMBULATORY_CARE_PROVIDER_SITE_OTHER): Payer: BC Managed Care – PPO | Admitting: Sports Medicine

## 2021-01-18 VITALS — BP 116/78 | Ht 66.0 in | Wt 130.0 lb

## 2021-01-18 DIAGNOSIS — S86811A Strain of other muscle(s) and tendon(s) at lower leg level, right leg, initial encounter: Secondary | ICD-10-CM

## 2021-01-18 NOTE — Patient Instructions (Signed)
  Three-week follow-up -- If the patient demonstrates steady improvement in symptoms and function at the three-week follow-up visit, they should begin performing the Alfredson protocol for Achilles tendon injury. This protocol is described in detail separately and summarized in the accompanying table (table 1). (See "Achilles tendinopathy and tendon rupture", section on 'Rehabilitation using resistance exercise'.)  When the patient is able to perform eccentric heel drops for three sets of 15 repetitions on one leg, he or she may begin easy running every other day on a level surface, not to exceed 15 to 20 minutes per session. Additional aerobic exercise can be done on a bicycle or stationary bicycle. Knowledgeable clinicians can monitor a patient's clinical progress with ultrasound.  Six-week follow-up -- If the patient continues to demonstrate meaningful progress at the six-week follow-up visit, allow them to start running for longer times and distances, but still on a level surface. As a rule of thumb, increases in running volume should not exceed five minutes per session during a given week, and no more than one additional running day should be added each week. As an example, if the patient ran three times for 20 minutes each session during week one, they could increase this to four times for 25 minutes each session during week two.

## 2021-01-18 NOTE — Progress Notes (Signed)
   Subjective:    Patient ID: Jacqueline Michael, female    DOB: 1971/02/21, 50 y.o.   MRN: [id number]  HPI  Jacqueline Michael comes in today for follow-up on a right lower leg injury.  Her knee pain has resolved.  She is still having some right calf pain.  She has been able to return to some recreational walking but still feels tightness in her calf if she overdoes it.  She is performing her toe raises but it does still caused some discomfort.  She has not noticed any swelling.  She has been wearing her calf sleeve and using her heel lifts.    Review of Systems As above    Objective:   Physical Exam  Well-developed, well-nourished.  No acute distress  Right calf: There is still some slight tenderness to palpation in the mid substance of the right calf.  No swelling.  Good strength.  Good pulses distally.      Assessment & Plan:   Improving right calf strain  Jacqueline Michael has a big race coming up in approximately 12 weeks.  In order to give her the best chance to compete, I recommend that she work with a physical therapist.  Jacqueline Michael is a physical therapist that she is familiar with.  I have given her a prescription to start working with Jacqueline Michael and I have also printed a copy of the up-to-date return to running protocol.  If Jacqueline Michael has any questions then she may contact me directly.  Otherwise, I will allow Jacqueline Michael to wean to a home exercise program per the physical therapist's discretion and follow-up with me as needed.

## 2021-02-10 DIAGNOSIS — Z13820 Encounter for screening for osteoporosis: Secondary | ICD-10-CM | POA: Diagnosis not present

## 2021-02-10 DIAGNOSIS — Z1231 Encounter for screening mammogram for malignant neoplasm of breast: Secondary | ICD-10-CM | POA: Diagnosis not present

## 2021-02-10 DIAGNOSIS — M810 Age-related osteoporosis without current pathological fracture: Secondary | ICD-10-CM | POA: Diagnosis not present

## 2021-02-10 LAB — HM DEXA SCAN

## 2021-02-21 DIAGNOSIS — F4322 Adjustment disorder with anxiety: Secondary | ICD-10-CM | POA: Diagnosis not present

## 2021-03-08 DIAGNOSIS — T63451D Toxic effect of venom of hornets, accidental (unintentional), subsequent encounter: Secondary | ICD-10-CM | POA: Diagnosis not present

## 2021-03-08 DIAGNOSIS — T63461D Toxic effect of venom of wasps, accidental (unintentional), subsequent encounter: Secondary | ICD-10-CM | POA: Diagnosis not present

## 2021-03-23 DIAGNOSIS — F4322 Adjustment disorder with anxiety: Secondary | ICD-10-CM | POA: Diagnosis not present

## 2021-05-03 DIAGNOSIS — F4322 Adjustment disorder with anxiety: Secondary | ICD-10-CM | POA: Diagnosis not present

## 2021-05-05 DIAGNOSIS — T63461D Toxic effect of venom of wasps, accidental (unintentional), subsequent encounter: Secondary | ICD-10-CM | POA: Diagnosis not present

## 2021-05-05 DIAGNOSIS — T63451D Toxic effect of venom of hornets, accidental (unintentional), subsequent encounter: Secondary | ICD-10-CM | POA: Diagnosis not present

## 2021-05-11 ENCOUNTER — Encounter: Payer: Self-pay | Admitting: Sports Medicine

## 2021-05-16 ENCOUNTER — Encounter: Payer: Self-pay | Admitting: Family Medicine

## 2021-05-16 ENCOUNTER — Ambulatory Visit (INDEPENDENT_AMBULATORY_CARE_PROVIDER_SITE_OTHER): Payer: BC Managed Care – PPO | Admitting: Family Medicine

## 2021-05-16 VITALS — Ht 66.0 in | Wt 128.0 lb

## 2021-05-16 DIAGNOSIS — M816 Localized osteoporosis [Lequesne]: Secondary | ICD-10-CM

## 2021-05-16 DIAGNOSIS — D1801 Hemangioma of skin and subcutaneous tissue: Secondary | ICD-10-CM | POA: Diagnosis not present

## 2021-05-16 DIAGNOSIS — Z1283 Encounter for screening for malignant neoplasm of skin: Secondary | ICD-10-CM | POA: Diagnosis not present

## 2021-05-16 DIAGNOSIS — D225 Melanocytic nevi of trunk: Secondary | ICD-10-CM | POA: Diagnosis not present

## 2021-05-16 DIAGNOSIS — M810 Age-related osteoporosis without current pathological fracture: Secondary | ICD-10-CM | POA: Insufficient documentation

## 2021-05-16 DIAGNOSIS — L853 Xerosis cutis: Secondary | ICD-10-CM | POA: Diagnosis not present

## 2021-05-16 NOTE — Progress Notes (Signed)
  Jacqueline Michael - 50 y.o. female MRN [id number]  Date of birth: 30-Dec-1970  SUBJECTIVE:  Including CC & ROS.  No chief complaint on file.   Jacqueline Michael is a 50 y.o. female that is presenting with osteoporosis.  Recent DEXA scan was showing a T score of -2.9.  A she has a history of hyperparathyroidism and subsequent surgery.  She has a history of stress fracture in the right hip with a stress change in the sacrum.  She does do long distance training and endurance training.   Review of Systems See HPI   HISTORY: Past Medical, Surgical, Social, and Family History Reviewed & Updated per EMR.   Pertinent Historical Findings include:  History reviewed. No pertinent past medical history.  History reviewed. No pertinent surgical history.  History reviewed. No pertinent family history.  Social History   Socioeconomic History   Marital status: Married    Spouse name: Not on file   Number of children: Not on file   Years of education: Not on file   Highest education level: Not on file  Occupational History   Not on file  Tobacco Use   Smoking status: Never   Smokeless tobacco: Never  Substance and Sexual Activity   Alcohol use: Not on file   Drug use: Not on file   Sexual activity: Not on file  Other Topics Concern   Not on file  Social History Narrative   Not on file   Social Determinants of Health   Financial Resource Strain: Not on file  Food Insecurity: Not on file  Transportation Needs: Not on file  Physical Activity: Not on file  Stress: Not on file  Social Connections: Not on file  Intimate Partner Violence: Not on file     PHYSICAL EXAM:  VS: Ht 5\' 6"  (1.676 m)   Wt 128 lb (58.1 kg)   BMI 20.66 kg/m  Physical Exam Gen: NAD, alert, cooperative with exam, well-appearing      ASSESSMENT & PLAN:   Localized osteoporosis without current pathological fracture Has a history of stress fracture of the hip and DEXA scan showing osteoporosis.  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  Lab work has been fairly normal since that time. -Counseled on home exercise therapy and supportive care. -Consider Prolia

## 2021-05-16 NOTE — Assessment & Plan Note (Signed)
Has a history of stress fracture of the hip and DEXA scan showing osteoporosis.  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  Lab work has been fairly normal since that time. -Counseled on home exercise therapy and supportive care. -Consider Prolia

## 2021-05-20 ENCOUNTER — Encounter: Payer: Self-pay | Admitting: Family Medicine

## 2021-05-23 ENCOUNTER — Telehealth: Payer: Self-pay

## 2021-05-23 DIAGNOSIS — F4322 Adjustment disorder with anxiety: Secondary | ICD-10-CM | POA: Diagnosis not present

## 2021-05-23 NOTE — Telephone Encounter (Signed)
Prolia VOB initiated via [email or web address]  Last OV:  Next OV:  Last Prolia inj: NEW START Next Prolia inj DUE:   

## 2021-05-27 NOTE — Telephone Encounter (Addendum)
Needs CoPay Enrollment [email or web address]  Pt ready for scheduling on or after   Out-of-pocket cost due at time of visit: $624  Primary: [place] Even[place] co-[place]urance: 20% (approx $587) Admin fee co-insurance: 30% (approx $37)  Secondary: n/a Evenity co-insurance:  Admin fee co-insurance:   Deductible: $3,000 of $3,000 met   Prior Auth: not required PA# Valid:    ** This summary of benefits is an estimation of the patient's out-of-pocket cost. Exact cost may vary based on individual plan coverage.

## 2021-05-30 DIAGNOSIS — Z23 Encounter for immunization: Secondary | ICD-10-CM | POA: Diagnosis not present

## 2021-06-07 NOTE — Telephone Encounter (Signed)
Left message for patient to call back. Patient needs to register with Amgen/Evenity copay card program. I need to give her the following info: [email or web address] OR call [phone]

## 2021-06-08 NOTE — Telephone Encounter (Signed)
Pt informed of below.  She states she is still trying to figure out her schedule and the logistics of making it to our office once monthly for her Evenity injections. If she decides to proceed with Evenity, she will call to schedule.

## 2021-06-20 DIAGNOSIS — F4322 Adjustment disorder with anxiety: Secondary | ICD-10-CM | POA: Diagnosis not present

## 2021-07-28 DIAGNOSIS — F4322 Adjustment disorder with anxiety: Secondary | ICD-10-CM | POA: Diagnosis not present

## 2021-08-02 DIAGNOSIS — H9319 Tinnitus, unspecified ear: Secondary | ICD-10-CM | POA: Diagnosis not present

## 2021-08-02 DIAGNOSIS — M810 Age-related osteoporosis without current pathological fracture: Secondary | ICD-10-CM | POA: Diagnosis not present

## 2021-08-29 DIAGNOSIS — F4322 Adjustment disorder with anxiety: Secondary | ICD-10-CM | POA: Diagnosis not present

## 2021-09-07 NOTE — Telephone Encounter (Signed)
Copay enrollment complete ? ? ?

## 2021-09-11 DIAGNOSIS — R109 Unspecified abdominal pain: Secondary | ICD-10-CM | POA: Diagnosis not present

## 2021-09-11 DIAGNOSIS — R1012 Left upper quadrant pain: Secondary | ICD-10-CM | POA: Diagnosis not present

## 2021-09-21 DIAGNOSIS — K209 Esophagitis, unspecified without bleeding: Secondary | ICD-10-CM | POA: Diagnosis not present

## 2021-09-21 DIAGNOSIS — Z6821 Body mass index (BMI) 21.0-21.9, adult: Secondary | ICD-10-CM | POA: Diagnosis not present

## 2021-09-21 DIAGNOSIS — Z20822 Contact with and (suspected) exposure to covid-19: Secondary | ICD-10-CM | POA: Diagnosis not present

## 2021-09-21 DIAGNOSIS — J9811 Atelectasis: Secondary | ICD-10-CM | POA: Diagnosis not present

## 2021-09-21 DIAGNOSIS — R109 Unspecified abdominal pain: Secondary | ICD-10-CM | POA: Diagnosis not present

## 2021-09-21 DIAGNOSIS — R1012 Left upper quadrant pain: Secondary | ICD-10-CM | POA: Diagnosis not present

## 2021-09-21 DIAGNOSIS — R1011 Right upper quadrant pain: Secondary | ICD-10-CM | POA: Diagnosis not present

## 2021-09-22 DIAGNOSIS — R1012 Left upper quadrant pain: Secondary | ICD-10-CM | POA: Diagnosis not present

## 2021-09-22 DIAGNOSIS — K209 Esophagitis, unspecified without bleeding: Secondary | ICD-10-CM | POA: Diagnosis not present

## 2021-09-23 DIAGNOSIS — H9313 Tinnitus, bilateral: Secondary | ICD-10-CM | POA: Diagnosis not present

## 2021-09-23 DIAGNOSIS — H903 Sensorineural hearing loss, bilateral: Secondary | ICD-10-CM | POA: Diagnosis not present

## 2021-09-27 DIAGNOSIS — F4322 Adjustment disorder with anxiety: Secondary | ICD-10-CM | POA: Diagnosis not present

## 2021-09-28 DIAGNOSIS — F4322 Adjustment disorder with anxiety: Secondary | ICD-10-CM | POA: Diagnosis not present

## 2021-10-25 DIAGNOSIS — F4322 Adjustment disorder with anxiety: Secondary | ICD-10-CM | POA: Diagnosis not present

## 2021-11-21 DIAGNOSIS — K6389 Other specified diseases of intestine: Secondary | ICD-10-CM | POA: Diagnosis not present

## 2021-11-21 DIAGNOSIS — Z1211 Encounter for screening for malignant neoplasm of colon: Secondary | ICD-10-CM | POA: Diagnosis not present

## 2021-11-21 DIAGNOSIS — D123 Benign neoplasm of transverse colon: Secondary | ICD-10-CM | POA: Diagnosis not present

## 2021-11-21 DIAGNOSIS — D128 Benign neoplasm of rectum: Secondary | ICD-10-CM | POA: Diagnosis not present

## 2021-11-25 DIAGNOSIS — H9313 Tinnitus, bilateral: Secondary | ICD-10-CM | POA: Diagnosis not present

## 2021-11-28 DIAGNOSIS — F4322 Adjustment disorder with anxiety: Secondary | ICD-10-CM | POA: Diagnosis not present

## 2021-12-12 DIAGNOSIS — F4322 Adjustment disorder with anxiety: Secondary | ICD-10-CM | POA: Diagnosis not present

## 2021-12-13 DIAGNOSIS — H9313 Tinnitus, bilateral: Secondary | ICD-10-CM | POA: Diagnosis not present

## 2022-01-03 DIAGNOSIS — H9313 Tinnitus, bilateral: Secondary | ICD-10-CM | POA: Diagnosis not present

## 2022-01-10 NOTE — Telephone Encounter (Signed)
Pt archived in [email or web address].  Please advise if patient and/or provider wish to proceed with EVENITY therpay.  

## 2022-01-30 DIAGNOSIS — F4322 Adjustment disorder with anxiety: Secondary | ICD-10-CM | POA: Diagnosis not present

## 2022-02-15 DIAGNOSIS — M25512 Pain in left shoulder: Secondary | ICD-10-CM | POA: Diagnosis not present

## 2022-02-15 DIAGNOSIS — R079 Chest pain, unspecified: Secondary | ICD-10-CM | POA: Diagnosis not present

## 2022-02-15 DIAGNOSIS — R0789 Other chest pain: Secondary | ICD-10-CM | POA: Diagnosis not present

## 2022-02-15 DIAGNOSIS — Z79899 Other long term (current) drug therapy: Secondary | ICD-10-CM | POA: Diagnosis not present

## 2022-02-15 DIAGNOSIS — R071 Chest pain on breathing: Secondary | ICD-10-CM | POA: Diagnosis not present

## 2022-03-06 IMAGING — DX DG TIBIA/FIBULA 2V*R*
2 series · 2 of 2 positions shown · non-contrast
Comparison: No prior.

CLINICAL DATA: Injury.  Pain.

EXAM:
RIGHT TIBIA AND FIBULA - 2 VIEW

[dg tibia/fibula right (1 of 2)]
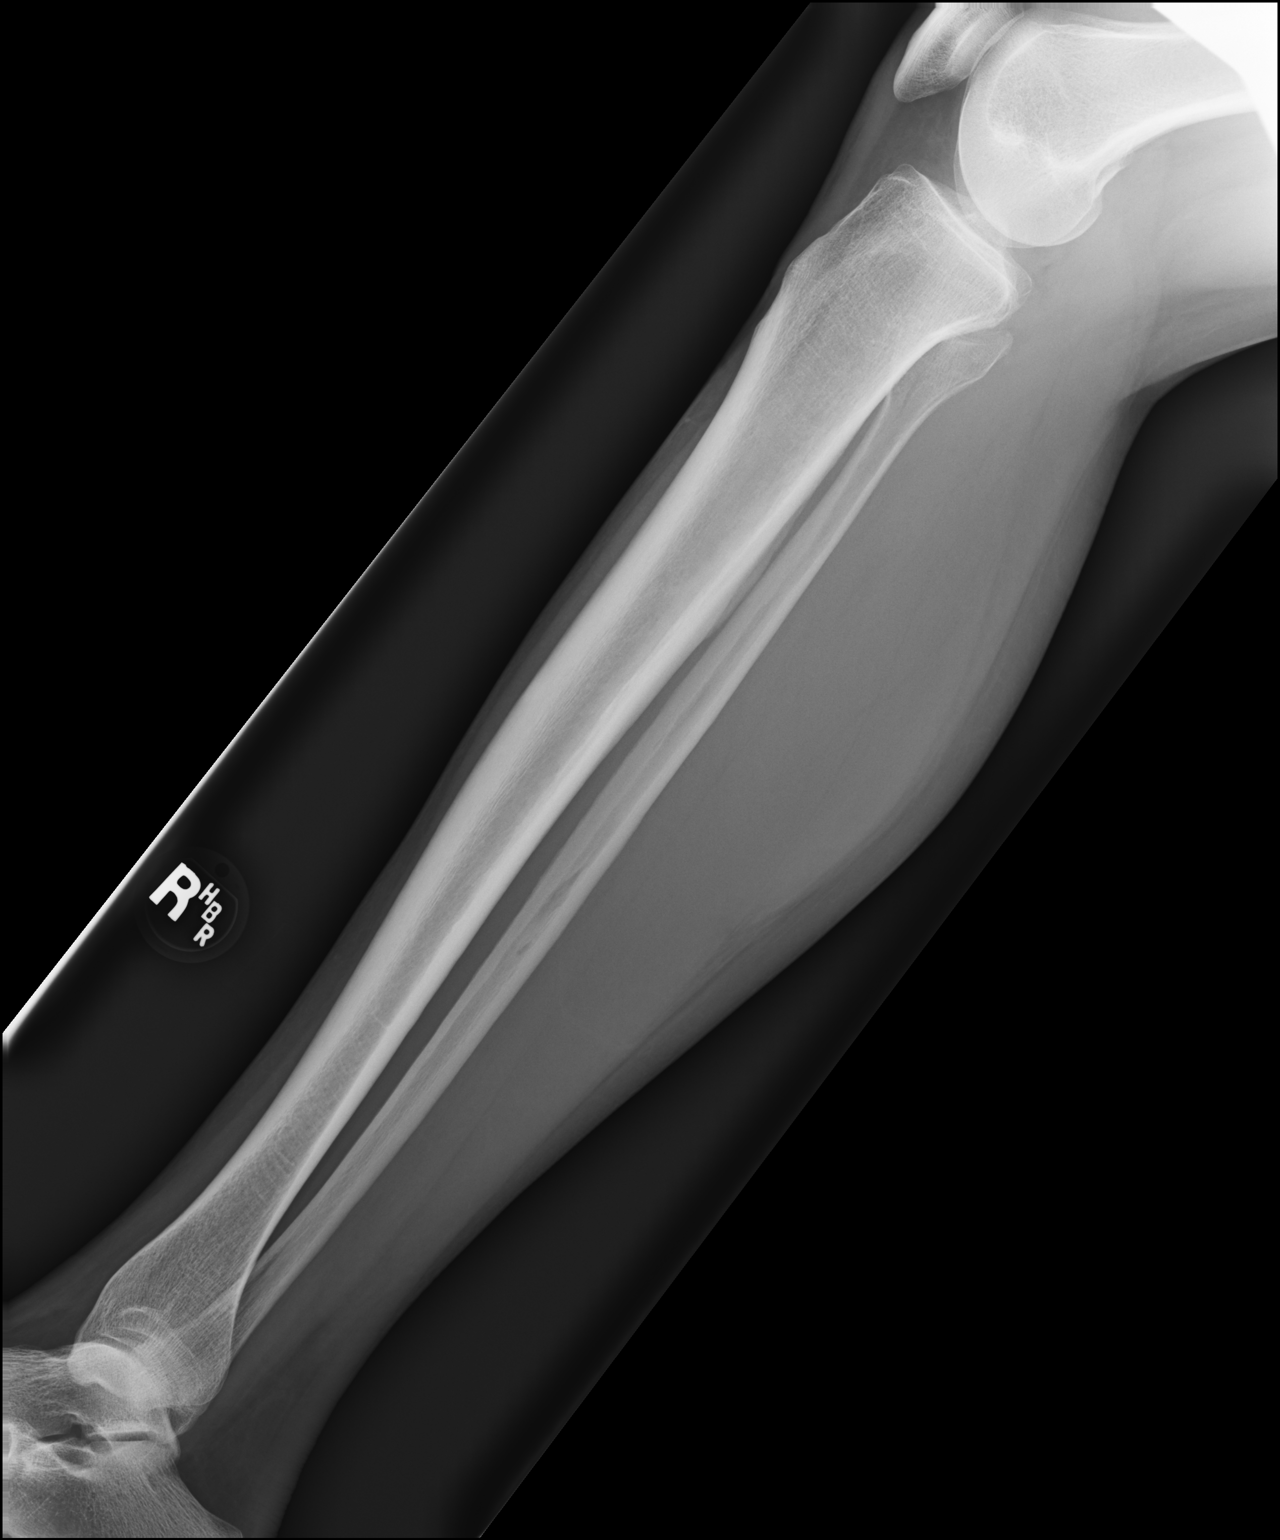

[dg tibia/fibula right (2 of 2)]
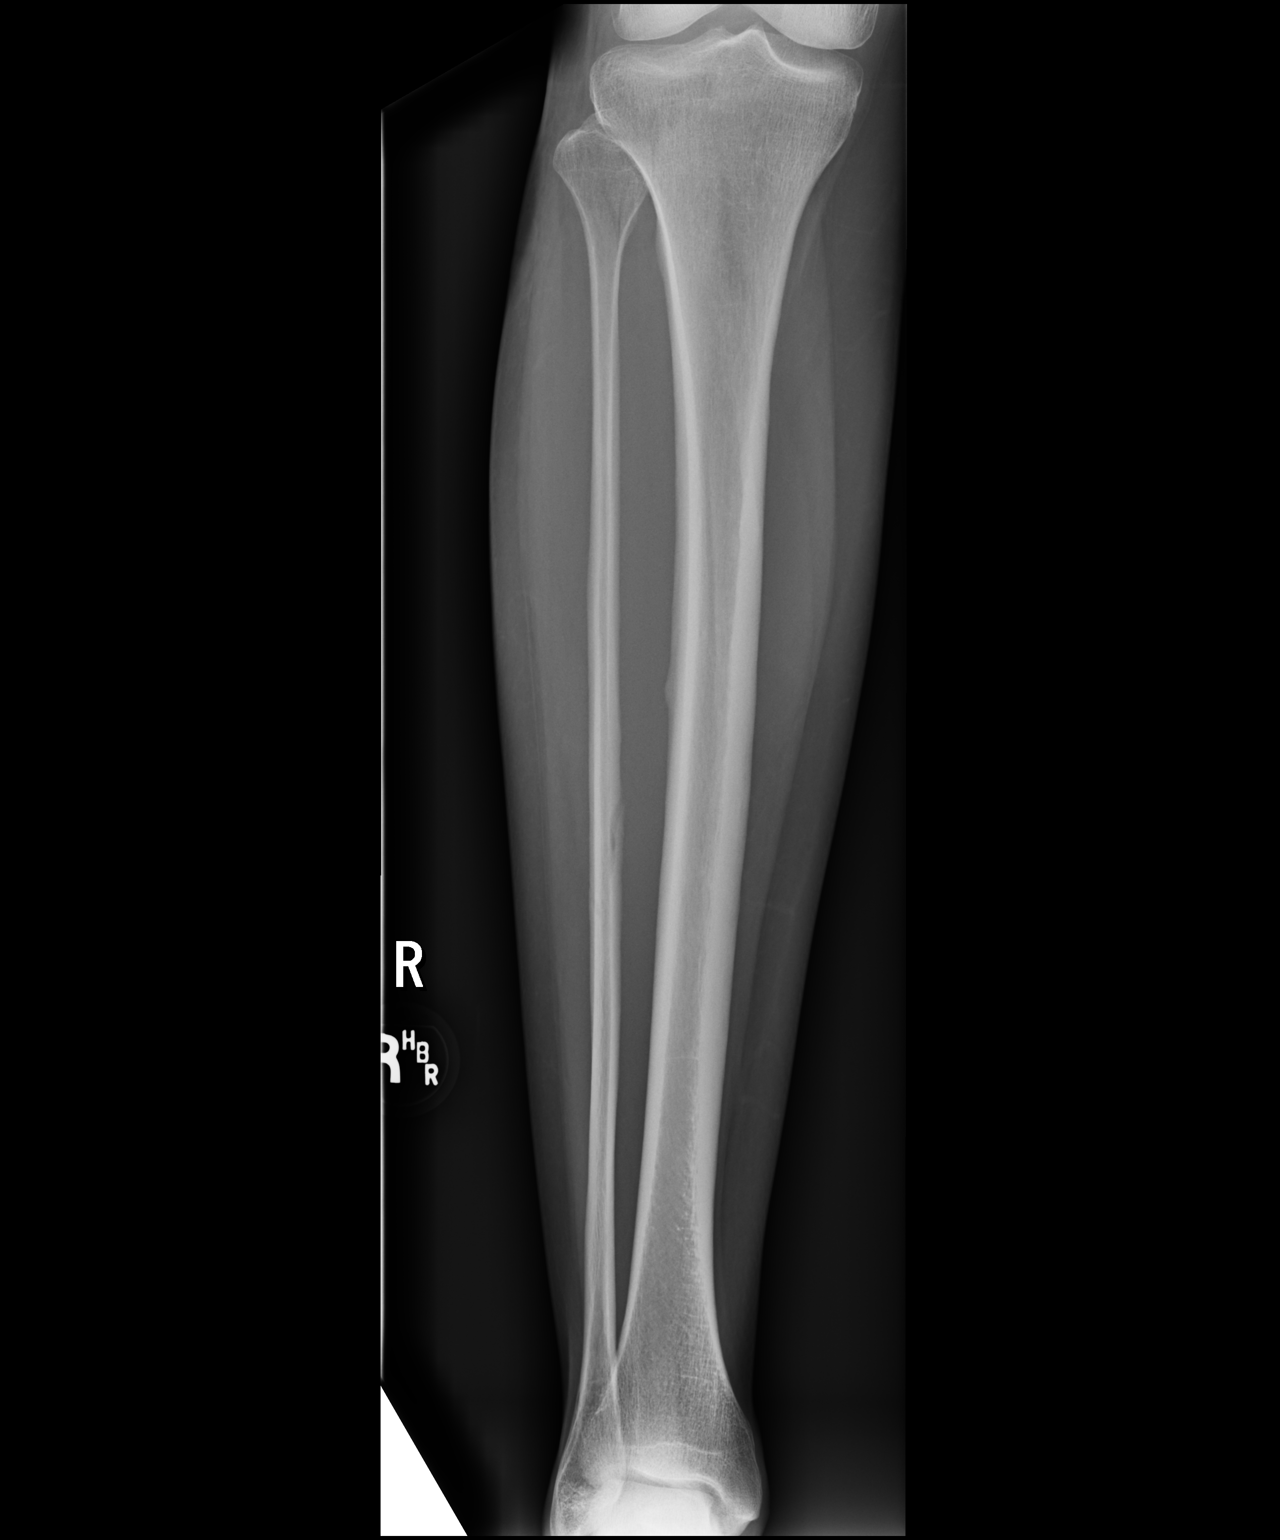

[2 of 2 positions shown; findings below may reference images not displayed]

FINDINGS: No acute bony or joint abnormality. No evidence of fracture or
dislocation.
IMPRESSION: No acute abnormality.

## 2022-03-27 DIAGNOSIS — F4322 Adjustment disorder with anxiety: Secondary | ICD-10-CM | POA: Diagnosis not present

## 2022-04-12 DIAGNOSIS — Z6821 Body mass index (BMI) 21.0-21.9, adult: Secondary | ICD-10-CM | POA: Diagnosis not present

## 2022-04-12 DIAGNOSIS — Z0000 Encounter for general adult medical examination without abnormal findings: Secondary | ICD-10-CM | POA: Diagnosis not present

## 2022-04-21 DIAGNOSIS — E785 Hyperlipidemia, unspecified: Secondary | ICD-10-CM | POA: Diagnosis not present

## 2022-04-21 DIAGNOSIS — E892 Postprocedural hypoparathyroidism: Secondary | ICD-10-CM | POA: Diagnosis not present

## 2022-04-21 DIAGNOSIS — E538 Deficiency of other specified B group vitamins: Secondary | ICD-10-CM | POA: Diagnosis not present

## 2022-04-21 DIAGNOSIS — E559 Vitamin D deficiency, unspecified: Secondary | ICD-10-CM | POA: Diagnosis not present

## 2022-05-22 DIAGNOSIS — Z1231 Encounter for screening mammogram for malignant neoplasm of breast: Secondary | ICD-10-CM | POA: Diagnosis not present

## 2022-05-29 DIAGNOSIS — F4322 Adjustment disorder with anxiety: Secondary | ICD-10-CM | POA: Diagnosis not present

## 2022-07-12 ENCOUNTER — Other Ambulatory Visit: Payer: Self-pay

## 2022-07-12 DIAGNOSIS — M25572 Pain in left ankle and joints of left foot: Secondary | ICD-10-CM

## 2022-07-13 ENCOUNTER — Ambulatory Visit
Admission: RE | Admit: 2022-07-13 | Discharge: 2022-07-13 | Disposition: A | Discharge status: Home / Self Care | Payer: BC Managed Care – PPO | Source: Ambulatory Visit | Attending: Sports Medicine | Admitting: Sports Medicine

## 2022-07-13 ENCOUNTER — Ambulatory Visit (INDEPENDENT_AMBULATORY_CARE_PROVIDER_SITE_OTHER): Payer: BC Managed Care – PPO | Admitting: Sports Medicine

## 2022-07-13 VITALS — BP 110/64 | Ht 66.0 in | Wt 130.0 lb

## 2022-07-13 DIAGNOSIS — M25572 Pain in left ankle and joints of left foot: Secondary | ICD-10-CM

## 2022-07-13 DIAGNOSIS — S8265XA Nondisplaced fracture of lateral malleolus of left fibula, initial encounter for closed fracture: Secondary | ICD-10-CM

## 2022-07-13 NOTE — Progress Notes (Signed)
   Subjective:    Patient ID: Jacqueline Michael, female    DOB: [birth year], 51 y.o.   MRN: [id number]  HPI chief complaint: Left ankle pain  Jacqueline Michael presents today complaining of left ankle pain that began 11 days ago.  She inverted the ankle while running.  Felt a pop and had significant pain at the time.  She then developed subsequent bruising and swelling.  She was initially using crutches but has weaned from those.  She also has several different types of ankle braces which she has brought with her today.  She localizes all of her pain to the distal fibula.  Denies any pain in the medial ankle.  No pain in the foot.  She is concerned about possible fracture.    Review of Systems As above    Objective:   Physical Exam  Well-developed, well-nourished.  No acute distress  Left ankle: There is ecchymosis primarily along the medial aspect of the ankle but no obvious soft tissue swelling.  She is tender to palpation over the tip of the distal fibula.  No tenderness over the medial malleolus nor over the deltoid ligament.  She has fairly good range of motion.  No appreciable laxity with anterior drawer or talar tilt testing.  Good pulses.  Ambulating with a slight limp.  X-rays of the left ankle including AP, lateral, and mortise views show a nondisplaced fracture through the distal tip of the fibula.  No other fractures are seen.      Assessment & Plan:   Left ankle pain secondary to nondisplaced fracture of the left distal fibular tip  This is a stable fracture.  Jacqueline Michael will wear her Aircast for approximately the next 4 weeks when ambulating.  She may resume nonweightbearing exercise as symptoms allow.  Follow-up in 4 weeks for reevaluation.  Will plan on repeating x-rays at that visit and hopefully we will be able to advance her rehabilitation to strengthening and proprioception.  She is advised to do no recreational walking or running until that follow-up visit.  This note was dictated  using Dragon naturally speaking software and may contain errors in syntax, spelling, or content which have not been identified prior to signing this note.

## 2022-07-21 DIAGNOSIS — Z8781 Personal history of (healed) traumatic fracture: Secondary | ICD-10-CM | POA: Diagnosis not present

## 2022-07-21 DIAGNOSIS — M810 Age-related osteoporosis without current pathological fracture: Secondary | ICD-10-CM | POA: Diagnosis not present

## 2022-07-21 DIAGNOSIS — N951 Menopausal and female climacteric states: Secondary | ICD-10-CM | POA: Diagnosis not present

## 2022-08-14 ENCOUNTER — Ambulatory Visit
Admission: RE | Admit: 2022-08-14 | Discharge: 2022-08-14 | Disposition: A | Discharge status: Home / Self Care | Payer: BC Managed Care – PPO | Source: Ambulatory Visit | Attending: Sports Medicine | Admitting: Sports Medicine

## 2022-08-14 ENCOUNTER — Ambulatory Visit (INDEPENDENT_AMBULATORY_CARE_PROVIDER_SITE_OTHER): Payer: BC Managed Care – PPO | Admitting: Sports Medicine

## 2022-08-14 VITALS — BP 120/82 | Ht 66.0 in | Wt 135.0 lb

## 2022-08-14 DIAGNOSIS — S8265XD Nondisplaced fracture of lateral malleolus of left fibula, subsequent encounter for closed fracture with routine healing: Secondary | ICD-10-CM

## 2022-08-14 DIAGNOSIS — G35 Multiple sclerosis: Secondary | ICD-10-CM | POA: Diagnosis not present

## 2022-08-14 DIAGNOSIS — S82832A Other fracture of upper and lower end of left fibula, initial encounter for closed fracture: Secondary | ICD-10-CM | POA: Diagnosis not present

## 2022-08-14 DIAGNOSIS — M25572 Pain in left ankle and joints of left foot: Secondary | ICD-10-CM

## 2022-08-14 NOTE — Progress Notes (Cosign Needed Addendum)
Established Patient Office Visit  Subjective   Patient ID: Jacqueline Michael, female    DOB: 03/09/1971  Age: 52 y.o. MRN: [id number]  CC: Left ankle fracture   HPI: Jacqueline Michael is a 52 year old female with a history of osteoporosis, stress fracture of left femoral head (04/2015), and left calcaneal cuboidal avulsion fracture (11/2017) who presents for follow-up of left lateral malleolus fracture. She was last seen on 12/7 for left ankle pain that began after inverting her ankle while running on 11/26. Left ankle XR was consistent with nondisplaced fracture. She went to [place] for the last week of December and tolerated hiking 3-5 miles daily with some pain. She wore her Aircast for 4 weeks as instructed. She has almost no pain with walking and has been able to do squats and use the elliptical at the gym wearing her lace-up ankle brace. She tried returning to running yesterday 1/7 with her lace-up ankle brace and noticed some soreness but no change in the baseline level of swelling. She has the greatest amount of pain with calf raises. Overall she is happy with her level of improvement.     Objective:    BP 120/82   Ht 5\' 6"  (1.676 m)   Wt 135 lb (61.2 kg)   BMI 21.79 kg/m  Vital signs reviewed.   Physical Exam Left ankle: Mild swelling present at left lateral malleolus, no redness or bruising. Minimally tender to palpation of lateral malleolus. Nontender to palpation of medial ankle. Slightly reduced plantarflexion and dorsiflexion compared to right ankle. Intact inversion and eversion of ankle.   Imaging  Left Ankle XR 1/8 FINDINGS: Redemonstration of transverse nondisplaced now subacute fracture of the distal fibula. Minimal early healing sclerosis. Minimal lateral ankle soft tissue swelling, decreased from prior. The ankle mortise remains symmetric and intact.  Left Ankle XR 12/7 FINDINGS: There is a nondisplaced fracture of the distal fibula tip. Ankle mortise is preserved.  There is mild adjacent soft tissue swelling. No additional fracture is noted.   Assessment & Plan:   Problem List Items Addressed This Visit       Musculoskeletal and Integument   Nondisplaced fracture of lateral malleolus of left fibula, subsequent encounter for closed fracture with routine healing - Primary    Repeat left ankle x-rays show stable lateral malleolus fracture. No need for future imaging at this time. Patient is showing clinically improvement in pain and weight-bearing. Will prescribe ankle rehab exercises. Encouraged holding off on restarting running at this time to promote healing and letting pain guide activity. She will wear the lace-up brace for the next 3 weeks during the day and then for the next 3 months on uneven surfaces. Follow-up as needed.        Stefani Dama, Medical Student  Patient seen and evaluated with the medical student.  I agree with the above plan of care.  X-ray shows early callus formation and no change in fracture position.  She should do well.  She will start ankle rehab exercises including range of motion and proprioception as tolerated.  She may also increase activity as tolerated but I did caution her about returning to running too soon.  Nonetheless, she may use pain as her guide.  I did recommend that she continue with her lace up ankle brace for protection when on uneven surfaces for the next several weeks.  She will follow-up for ongoing or recalcitrant issues  This note was dictated using Dragon naturally speaking software  and may contain errors in syntax, spelling, or content which have not been identified prior to signing this note.

## 2022-08-14 NOTE — Assessment & Plan Note (Addendum)
Repeat left ankle x-rays show stable lateral malleolus fracture. No need for future imaging at this time. Patient is showing clinically improvement in pain and weight-bearing. Will prescribe ankle rehab exercises. Encouraged holding off on restarting running at this time to promote healing and letting pain guide activity. She will wear the lace-up brace for the next 3 weeks during the day and then for the next 3 months on uneven surfaces. Follow-up as needed.

## 2022-08-25 DIAGNOSIS — Z6822 Body mass index (BMI) 22.0-22.9, adult: Secondary | ICD-10-CM | POA: Diagnosis not present

## 2022-08-25 DIAGNOSIS — H9202 Otalgia, left ear: Secondary | ICD-10-CM | POA: Diagnosis not present

## 2022-08-25 DIAGNOSIS — H6992 Unspecified Eustachian tube disorder, left ear: Secondary | ICD-10-CM | POA: Diagnosis not present

## 2022-08-25 DIAGNOSIS — E892 Postprocedural hypoparathyroidism: Secondary | ICD-10-CM | POA: Diagnosis not present

## 2022-08-28 NOTE — Telephone Encounter (Signed)
EVENITY VOB initiated via [email or web address]  Last OV: 05/16/2021 Next OV: not scheduled  New start

## 2022-08-29 ENCOUNTER — Encounter: Payer: Self-pay | Admitting: *Deleted

## 2022-08-30 ENCOUNTER — Ambulatory Visit (INDEPENDENT_AMBULATORY_CARE_PROVIDER_SITE_OTHER): Payer: BC Managed Care – PPO | Admitting: Family Medicine

## 2022-08-30 ENCOUNTER — Encounter: Payer: Self-pay | Admitting: Family Medicine

## 2022-08-30 ENCOUNTER — Ambulatory Visit: Payer: Self-pay

## 2022-08-30 VITALS — BP 120/78 | Ht 66.0 in | Wt 135.0 lb

## 2022-08-30 DIAGNOSIS — M8000XG Age-related osteoporosis with current pathological fracture, unspecified site, subsequent encounter for fracture with delayed healing: Secondary | ICD-10-CM | POA: Diagnosis not present

## 2022-08-30 DIAGNOSIS — M80062G Age-related osteoporosis with current pathological fracture, left lower leg, subsequent encounter for fracture with delayed healing: Secondary | ICD-10-CM

## 2022-08-30 DIAGNOSIS — M84469A Pathological fracture, unspecified tibia and fibula, initial encounter for fracture: Secondary | ICD-10-CM | POA: Insufficient documentation

## 2022-08-30 DIAGNOSIS — M25562 Pain in left knee: Secondary | ICD-10-CM

## 2022-08-30 NOTE — Assessment & Plan Note (Signed)
Acutely occurring since she has returned back to training.  Findings on ultrasound are most consistent with insufficiency fracture or early subchondral marrow edema.  She does have history of osteoporosis with current avulsion fracture of the distal fibula on the left. -Counseled on home exercise therapy and supportive care. -Counseled on vitamin K2 and vitamin D3. -Counseled on compression. -Could consider further imaging.

## 2022-08-30 NOTE — Assessment & Plan Note (Signed)
Acute on chronic in nature.  She has a significant fracture history with a stress fracture in her right hip.  Has a current insufficiency fracture with an avulsion fracture of the left ankle.  Has a history of hyperparathyroidism that led to surgery and a bone density from 2 years ago showing significant osteoporosis. -Counseled on home exercise therapy and supportive care. -Pursue Evenity given the amount of fractures she has experienced in the setting of the significant T-score given her age

## 2022-08-30 NOTE — Patient Instructions (Signed)
Good to see you Please take vitamin D3 and vitamin K2 Please try the compression  We'll call with the evenity benefits   Please send me a message in MyChart with any questions or updates.  Please see me back to start evenity.   --Dr. Raeford Razor

## 2022-08-30 NOTE — Progress Notes (Signed)
  Jacqueline Michael - 52 y.o. female MRN [id number]  Date of birth: [birth year]  SUBJECTIVE:  Including CC & ROS.  No chief complaint on file.   Jacqueline Michael is a 52 y.o. female that is  presenting with acute left knee pain and acute on chronic osteoporosis. She reports a current left nondisplaced fracture of the left distal fibular tip. Has a history of hyperparathyroidism with subsequent removal with surgery. History of stress fracture in the right hip and stress change in the sacrum. .  Review of the bone density from 2022 shows a T score of -2.9    Review of Systems See HPI   HISTORY: Past Medical, Surgical, Social, and Family History Reviewed & Updated per EMR.   Pertinent Historical Findings include:  History reviewed. No pertinent past medical history.  History reviewed. No pertinent surgical history.   PHYSICAL EXAM:  VS: BP 120/78   Ht 5\' 6"  (1.676 m)   Wt 135 lb (61.2 kg)   BMI 21.79 kg/m  Physical Exam Gen: NAD, alert, cooperative with exam, well-appearing MSK:  Neurovascularly intact    Limited ultrasound: left knee pain:  No effusion suprapatellar pouch. Normal-appearing quadricep and patellar tendon. Normal-appearing lateral joint space. Normal-appearing medial joint space. Increased hyperemia over the lateral medial proximal tibia.  Summary: Findings concerning for a insufficiency fracture of the proximal tibia.  Ultrasound and interpretation by Clearance Coots, MD    ASSESSMENT & PLAN:   Insufficiency fracture of tibia Acutely occurring since she has returned back to training.  Findings on ultrasound are most consistent with insufficiency fracture or early subchondral marrow edema.  She does have history of osteoporosis with current avulsion fracture of the distal fibula on the left. -Counseled on home exercise therapy and supportive care. -Counseled on vitamin K2 and vitamin D3. -Counseled on compression. -Could consider further  imaging.  Osteoporosis Acute on chronic in nature.  She has a significant fracture history with a stress fracture in her right hip.  Has a current insufficiency fracture with an avulsion fracture of the left ankle.  Has a history of hyperparathyroidism that led to surgery and a bone density from 2 years ago showing significant osteoporosis. -Counseled on home exercise therapy and supportive care. -Pursue Evenity given the amount of fractures she has experienced in the setting of the significant T-score given her age

## 2022-08-31 NOTE — Telephone Encounter (Addendum)
Need to contact [employer] regarding Evenity VOB, pt keeps disappearing from the Amgen portal; per [facility], there are currently 2 different offices attempting benefits verification for pt.   Prior Authorization initiated for EVENITY via [email or web address] KEY: [id number]    NO prior auth/pre-determination required per [place]

## 2022-09-01 NOTE — Telephone Encounter (Signed)
Copay enrollment: ACTIVE Co-Pay member ID: [id number] Limit: $8,000 / year OOP Copay w/ Copay Assistance Card: $25

## 2022-09-04 ENCOUNTER — Telehealth: Payer: Self-pay | Admitting: *Deleted

## 2022-09-04 ENCOUNTER — Ambulatory Visit: Payer: BC Managed Care – PPO

## 2022-09-04 NOTE — Telephone Encounter (Addendum)
Pt ready for scheduling on or after 09/04/22  Out-of-pocket cost due at time of visit: $25  Enrolled in [place] copay assistance program  Primary: [employer] [place] Even[place] co-insurance: 30% Admin fee co-insurance: 30%  Secondary: n/a Evenity co-insurance:  Admin fee co-insurance:   Deductible: $442 of $3500 met OOP Max: $442 of $6000 met  Prior Auth: NOT required PA# Valid:    ** This summary of benefits is an estimation of the patient's out-of-pocket cost. Exact cost may vary based on individual plan coverage.    Spoke with patient and went over deductible, 30% co-insurance, Copay card option, and estimated OOP cost. Confirmed process for copay card - pt will need to provide EOB once received from [place], EOB will be uploaded to [employer] portal, then funds will be put on the card, debit card provided by [employer] will then be used to pay OOP cost for Evenity. This does not cover the [place] fee [phone]) and pt will be responsible for $25 copay at time of visit.   Estimated OOP cost without copay assistance: Inj 1: $2,295.30 (towards deductible) Inj 2: $1,222.48 (towards deductible and applicable co-insurance) Inj 3: $688.59 (30% co-insurance) Inj 4: $688.59 (30% co-insurance) Inj 5: $688.59 (30% co-insurance) Inj 5: $416.45 (has reached $6,000 OOP max) No remaining OOP cost for injections for 2024

## 2022-09-05 ENCOUNTER — Ambulatory Visit (INDEPENDENT_AMBULATORY_CARE_PROVIDER_SITE_OTHER): Payer: BC Managed Care – PPO | Admitting: *Deleted

## 2022-09-05 ENCOUNTER — Encounter: Payer: Self-pay | Admitting: *Deleted

## 2022-09-05 DIAGNOSIS — M8000XG Age-related osteoporosis with current pathological fracture, unspecified site, subsequent encounter for fracture with delayed healing: Secondary | ICD-10-CM

## 2022-09-05 MED ORDER — ROMOSOZUMAB-AQQG 105 MG/1.17ML ~~LOC~~ SOSY
210.0000 mg | PREFILLED_SYRINGE | Freq: Once | SUBCUTANEOUS | Status: AC
  Administered 2022-09-05: 210 mg via SUBCUTANEOUS

## 2022-09-05 NOTE — Progress Notes (Unsigned)
Patient is here for nurse visit for her 1st Evenity. She received bilateral  arm injections. She waited in the office for 15 minutes. She tolerated injections well. She will return in 1 month for nurse visit for her next Evneity. She will call us with any problems or severe side effects.

## 2022-09-08 NOTE — Telephone Encounter (Addendum)
Evenity Injection Schedule ([employer]gen Copay Assist) Inj #1 - 09/05/22 Inj #2 - 10/06/22 Inj #3 - 11/09/22 Inj #4 - 12/11/22 Inj #5 - 01/12/23 Inj #6 - 02/13/23 Inj #7 - scheduled 03/16/23 Inj #8 - scheduled 04/20/23 Inj #9  Inj #10  Inj #11  Inj #12

## 2022-10-06 ENCOUNTER — Encounter: Payer: Self-pay | Admitting: *Deleted

## 2022-10-06 ENCOUNTER — Ambulatory Visit: Payer: BC Managed Care – PPO | Admitting: *Deleted

## 2022-10-06 DIAGNOSIS — M8000XG Age-related osteoporosis with current pathological fracture, unspecified site, subsequent encounter for fracture with delayed healing: Secondary | ICD-10-CM | POA: Diagnosis not present

## 2022-10-06 MED ORDER — ROMOSOZUMAB-AQQG 105 MG/1.17ML ~~LOC~~ SOSY
210.0000 mg | PREFILLED_SYRINGE | Freq: Once | SUBCUTANEOUS | Status: AC
  Administered 2022-10-06: 210 mg via SUBCUTANEOUS

## 2022-10-06 NOTE — Progress Notes (Signed)
Patient is here for nurse visit for her 2nd Evenity. She received bilateral [place] arm injections.  She tolerated injections well. She will return in 1 month for nurse visit for her next Evneity. She will call us with any problems or severe side effects.

## 2022-10-17 ENCOUNTER — Ambulatory Visit (INDEPENDENT_AMBULATORY_CARE_PROVIDER_SITE_OTHER): Payer: BC Managed Care – PPO | Admitting: Sports Medicine

## 2022-10-17 ENCOUNTER — Ambulatory Visit
Admission: RE | Admit: 2022-10-17 | Discharge: 2022-10-17 | Disposition: A | Discharge status: Home / Self Care | Payer: BC Managed Care – PPO | Source: Ambulatory Visit | Attending: Sports Medicine | Admitting: Sports Medicine

## 2022-10-17 VITALS — BP 112/82 | Ht 66.0 in | Wt 135.0 lb

## 2022-10-17 DIAGNOSIS — M25562 Pain in left knee: Secondary | ICD-10-CM

## 2022-10-17 DIAGNOSIS — G8929 Other chronic pain: Secondary | ICD-10-CM

## 2022-10-17 NOTE — Patient Instructions (Signed)
[facility] Address: [address] [place], [place] [address] Phone: [phone]

## 2022-10-18 NOTE — Progress Notes (Addendum)
   Subjective:    Patient ID: Jacqueline Michael, female    DOB: [birth year], 52 y.o.   MRN: [id number]  HPI knee pain  [name] presents today complaining of medial sided left knee pain that has been present now for several months.  Shortly after her pain began, she saw Dr. Raeford Razor for evaluation and treatment for osteoporosis.  A bedside ultrasound of the proximal tibia at that time suggested a possible stress fracture.  She was treated with a compression sleeve and limited running.  This was initially helpful but has not been curative.  Her pain is present both with running and with walking.  She does localize it to the medial tibia.  She has not noticed any swelling.  She has been able to continue with other forms of exercise but running, walking, and squats reproduce her pain.  She also continues to experience pain and instability in the left ankle.  This is the same ankle that she suffered a small avulsion fracture of the distal fibula a little over 3 months ago.  She does continue wearing her med spec brace on occasion.  He has not experienced any returned swelling of the ankle.    Review of Systems As above    Objective:   Physical Exam  Well-developed, well-nourished.  No acute distress  Left knee: Full range of motion.  No effusion.  There is tenderness to palpation along the proximal tibia.  No significant tenderness at the medial joint line.  No tenderness along the lateral joint line.  Negative Thessaly's, negative McMurray's.  Knee is grossly stable to ligamentous exam.  Neurovascularly intact distally.  Left ankle: Full range of motion.  No effusion.  No soft tissue swelling.  There is still some slight tenderness to palpation over the distal fibula at the area of the previous avulsion fracture.  Ankle is stable ligamentous exam.  Good pulses.  X-ray of the left knee including AP, lateral, and sunrise views show nothing acute.  No obvious stress fracture.  Paucity of degenerative  changes.      Assessment & Plan:   Left knee pain worrisome for occult proximal tibial stress fracture Left ankle instability status post avulsion fracture  For the left knee, I recommend an MRI specifically to rule out medial tibial stress fracture.  I will message her through MyChart with those results when available.  In the meantime, she will continue with nonimpact type exercises and use of her compression sleeve.  For her left ankle, I believe her main issue is instability and weakness secondary to her injury.  I encouraged her to be very diligent with her ankle strengthening and proprioception exercises.  I also recommended a body helix compression sleeve and lieu of her med spec brace.  If she continues to have problems then I would recommend referral to formal physical therapy.  She will follow-up with me again in 4 weeks.  This note was dictated using Dragon naturally speaking software and may contain errors in syntax, spelling, or content which have not been identified prior to signing this note.

## 2022-10-23 DIAGNOSIS — M67864 Other specified disorders of tendon, left knee: Secondary | ICD-10-CM | POA: Diagnosis not present

## 2022-10-23 DIAGNOSIS — M25562 Pain in left knee: Secondary | ICD-10-CM | POA: Diagnosis not present

## 2022-10-23 DIAGNOSIS — M7041 Prepatellar bursitis, right knee: Secondary | ICD-10-CM | POA: Diagnosis not present

## 2022-10-30 ENCOUNTER — Encounter: Payer: Self-pay | Admitting: Sports Medicine

## 2022-11-09 ENCOUNTER — Ambulatory Visit (INDEPENDENT_AMBULATORY_CARE_PROVIDER_SITE_OTHER): Payer: BC Managed Care – PPO | Admitting: *Deleted

## 2022-11-09 ENCOUNTER — Encounter: Payer: Self-pay | Admitting: *Deleted

## 2022-11-09 DIAGNOSIS — M8000XG Age-related osteoporosis with current pathological fracture, unspecified site, subsequent encounter for fracture with delayed healing: Secondary | ICD-10-CM

## 2022-11-09 DIAGNOSIS — M810 Age-related osteoporosis without current pathological fracture: Secondary | ICD-10-CM

## 2022-11-09 MED ORDER — ROMOSOZUMAB-AQQG 105 MG/1.17ML ~~LOC~~ SOSY
210.0000 mg | PREFILLED_SYRINGE | Freq: Once | SUBCUTANEOUS | Status: AC
  Administered 2022-11-09: 210 mg via SUBCUTANEOUS

## 2022-11-09 NOTE — Progress Notes (Signed)
Patient is here for nurse visit for her 27rd Evenity. She received bilateral [place] arm injections.  She tolerated injections well. She will return in 1 month for nurse visit for her next Evenity. She will call us with any problems or severe side effects.

## 2022-11-10 ENCOUNTER — Ambulatory Visit: Payer: BC Managed Care – PPO

## 2022-11-20 ENCOUNTER — Encounter: Payer: Self-pay | Admitting: *Deleted

## 2022-12-11 ENCOUNTER — Ambulatory Visit: Payer: BC Managed Care – PPO | Admitting: *Deleted

## 2022-12-11 DIAGNOSIS — M810 Age-related osteoporosis without current pathological fracture: Secondary | ICD-10-CM

## 2022-12-11 MED ORDER — ROMOSOZUMAB-AQQG 105 MG/1.17ML ~~LOC~~ SOSY
210.0000 mg | PREFILLED_SYRINGE | Freq: Once | SUBCUTANEOUS | Status: AC
  Administered 2022-12-11: 210 mg via SUBCUTANEOUS

## 2022-12-11 NOTE — Progress Notes (Unsigned)
Patient is here for her #4 evenity injection. Patient received bilateral arm [place] evenity injections today. She tolerated injections well. She will return in 1 month for her next evenity injection.   

## 2022-12-12 DIAGNOSIS — N958 Other specified menopausal and perimenopausal disorders: Secondary | ICD-10-CM | POA: Diagnosis not present

## 2023-01-12 ENCOUNTER — Ambulatory Visit: Payer: BC Managed Care – PPO

## 2023-01-12 ENCOUNTER — Ambulatory Visit: Payer: BC Managed Care – PPO | Admitting: Family Medicine

## 2023-01-12 VITALS — Ht 66.0 in

## 2023-01-12 DIAGNOSIS — M810 Age-related osteoporosis without current pathological fracture: Secondary | ICD-10-CM

## 2023-01-12 MED ORDER — ROMOSOZUMAB-AQQG 105 MG/1.17ML ~~LOC~~ SOSY
210.0000 mg | PREFILLED_SYRINGE | Freq: Once | SUBCUTANEOUS | Status: AC
  Administered 2023-01-12: 210 mg via SUBCUTANEOUS

## 2023-01-12 NOTE — Progress Notes (Signed)
Patient is here for #5 evenity injection. Patient received bilateral arm [place] evenity injections today. She tolerated injections well. She will return in 1 month for her next evenity injection.

## 2023-02-13 ENCOUNTER — Ambulatory Visit: Payer: BC Managed Care – PPO | Admitting: Family Medicine

## 2023-02-13 DIAGNOSIS — N958 Other specified menopausal and perimenopausal disorders: Secondary | ICD-10-CM | POA: Diagnosis not present

## 2023-02-13 DIAGNOSIS — M810 Age-related osteoporosis without current pathological fracture: Secondary | ICD-10-CM | POA: Diagnosis not present

## 2023-02-13 MED ORDER — ROMOSOZUMAB-AQQG 105 MG/1.17ML ~~LOC~~ SOSY
210.0000 mg | PREFILLED_SYRINGE | Freq: Once | SUBCUTANEOUS | Status: AC
  Administered 2023-02-13: 210 mg via SUBCUTANEOUS

## 2023-02-13 NOTE — Progress Notes (Unsigned)
Patient is here for evenity injection #6. Patient received bilateral arm [place] evenity injections today. She tolerated injections well. She will return in 1 month for her next evenity injection.

## 2023-03-05 ENCOUNTER — Encounter: Payer: Self-pay | Admitting: *Deleted

## 2023-03-14 NOTE — Telephone Encounter (Signed)
Spoke with patient, if we cannot resolve billing issue by Friday, will plan to r/s NUR visit to the week of 8/19. I let pt know that my provider will be out of town next week so I will be able to spend a good amount of consecutive time talking with [employer] about the billing issue. Pt verbalized understanding and is OK with holding off on next Evenity injection until the week of 8/19.   Checked [employer], it appears that claims may have been denied d/t documentation not being submitted with claim.   Check [employer] co-pay assist, pt has $4,573.50 remaining on her co-pay card.

## 2023-03-16 ENCOUNTER — Ambulatory Visit: Payer: BC Managed Care – PPO | Admitting: Family Medicine

## 2023-03-21 NOTE — Telephone Encounter (Signed)
Spoke with Gwyneth Sprout in [place], she will work on submitted requested documentation and will let me know if she needs any additional information from me.

## 2023-03-28 NOTE — Telephone Encounter (Addendum)
I will draft a letter of medical necessity to send along with denial letter and original pre-determination request.   VOB has also been re-submitted via [employer] portal.

## 2023-03-30 NOTE — Telephone Encounter (Signed)
Prior auth / pre-determination request submitted via [email or web address] for continuation of therapy with Evenity.

## 2023-03-30 NOTE — Telephone Encounter (Signed)
Called and spoke with patient to provide detailed update about where we are with past and future Evenity injections.   Pt verbalized understanding, will plan to touch base next week.

## 2023-04-01 NOTE — Telephone Encounter (Signed)
Letter of medical necessity drafted and routed to Dr. Pearletha Forge to review and sign.

## 2023-04-06 NOTE — Telephone Encounter (Signed)
Have not yet received determination for prior auth for future injections.   Re-faxed via [email or web address]

## 2023-04-10 DIAGNOSIS — L811 Chloasma: Secondary | ICD-10-CM | POA: Diagnosis not present

## 2023-04-10 DIAGNOSIS — L738 Other specified follicular disorders: Secondary | ICD-10-CM | POA: Diagnosis not present

## 2023-04-10 DIAGNOSIS — L720 Epidermal cyst: Secondary | ICD-10-CM | POA: Diagnosis not present

## 2023-04-10 DIAGNOSIS — L821 Other seborrheic keratosis: Secondary | ICD-10-CM | POA: Diagnosis not present

## 2023-04-17 NOTE — Telephone Encounter (Signed)
Have not yet received determination from [facility] regarding pre-determination or appeal.

## 2023-04-20 ENCOUNTER — Ambulatory Visit: Payer: BC Managed Care – PPO | Admitting: Family Medicine

## 2023-04-22 NOTE — Telephone Encounter (Signed)
Prior Auth for remaining 6 Evenity injections APPROVED  PA# [id number] Valid: 04/02/23-04/01/24

## 2023-04-27 ENCOUNTER — Ambulatory Visit: Payer: BC Managed Care – PPO | Admitting: Family Medicine

## 2023-04-27 DIAGNOSIS — M810 Age-related osteoporosis without current pathological fracture: Secondary | ICD-10-CM

## 2023-04-27 MED ORDER — ROMOSOZUMAB-AQQG 105 MG/1.17ML ~~LOC~~ SOSY
210.0000 mg | PREFILLED_SYRINGE | Freq: Once | SUBCUTANEOUS | Status: AC
  Administered 2023-04-27: 210 mg via SUBCUTANEOUS

## 2023-04-27 NOTE — Progress Notes (Signed)
Patient is here for evenity injection #7. Patient received bilateral arm [place] evenity injections today. She tolerated injections well. She will return in 1 month for her next evenity injection.

## 2023-04-29 NOTE — Telephone Encounter (Addendum)
Evenity Injection Schedule ([employer] Copay Assist) Inj #1 - 09/05/22 Inj #2 - 10/06/22 Inj #3 - 11/09/22 Inj #4 - 12/11/22 Inj #5 - 01/12/23 Inj #6 - 02/13/23 Inj #7 - 04/27/23 Inj #8 - 05/29/23 Inj #9 - 07/03/23 Inj #10 - 08/06/23  2025 VOB initiated  Inj #11 - scheduled 2/3/225 Inj #12    PA# [id number] Valid: 04/02/23-04/01/24

## 2023-05-21 ENCOUNTER — Ambulatory Visit: Payer: BC Managed Care – PPO | Admitting: Family Medicine

## 2023-05-24 DIAGNOSIS — N951 Menopausal and female climacteric states: Secondary | ICD-10-CM | POA: Diagnosis not present

## 2023-05-29 ENCOUNTER — Ambulatory Visit: Payer: BC Managed Care – PPO | Admitting: Family Medicine

## 2023-05-29 VITALS — Ht 66.0 in

## 2023-05-29 DIAGNOSIS — M810 Age-related osteoporosis without current pathological fracture: Secondary | ICD-10-CM | POA: Diagnosis not present

## 2023-05-29 MED ORDER — ROMOSOZUMAB-AQQG 105 MG/1.17ML ~~LOC~~ SOSY
210.0000 mg | PREFILLED_SYRINGE | Freq: Once | SUBCUTANEOUS | Status: AC
  Administered 2023-05-29: 210 mg via SUBCUTANEOUS

## 2023-05-29 NOTE — Progress Notes (Signed)
Patient is here for evenity injection #8. Patient received bilateral arm [place] evenity injections today. She tolerated injections well. She will return in 1 month for her next evenity injection.

## 2023-06-06 ENCOUNTER — Ambulatory Visit (INDEPENDENT_AMBULATORY_CARE_PROVIDER_SITE_OTHER): Payer: BC Managed Care – PPO | Admitting: Sports Medicine

## 2023-06-06 ENCOUNTER — Encounter: Payer: Self-pay | Admitting: Sports Medicine

## 2023-06-06 ENCOUNTER — Ambulatory Visit (HOSPITAL_BASED_OUTPATIENT_CLINIC_OR_DEPARTMENT_OTHER)
Admission: RE | Admit: 2023-06-06 | Discharge: 2023-06-06 | Disposition: A | Discharge status: Home / Self Care | Payer: BC Managed Care – PPO | Source: Ambulatory Visit | Attending: Sports Medicine | Admitting: Sports Medicine

## 2023-06-06 VITALS — BP 120/64 | Ht 66.0 in | Wt 135.0 lb

## 2023-06-06 DIAGNOSIS — M25552 Pain in left hip: Secondary | ICD-10-CM | POA: Insufficient documentation

## 2023-06-06 DIAGNOSIS — M79652 Pain in left thigh: Secondary | ICD-10-CM | POA: Diagnosis not present

## 2023-06-06 NOTE — Progress Notes (Signed)
   Subjective:    Patient ID: Jacqueline Michael, female    DOB: 1971-04-03, 52 y.o.   MRN: [id number]  HPI chief complaint: Left thigh pain  Jacqueline Michael presents today with 2-1/2 weeks of left thigh and hip pain.  She was able to complete a half marathon in late September without any problems.  She returned to running she began to experience some pain initially in the left femur.  Pain then began to radiate into the left hip.  She has a history of a femoral neck stress fracture on this side and is currently taking Evenity for osteoporosis.  Today, her hip pain is little better but her primary pain continues to center around the mid femur.  Her pain causes her to limp toward the end of the day.  She has not been doing any running.  She has an [place] trip planned for 5 weeks from now.    Review of Systems As above    Objective:   Physical Exam  Well-developed, well-nourished.  No acute distress  Left hip: Smooth painless hip range of motion with a negative logroll.  Negative FADIR, negative FABER.  No tenderness to palpation.  She does have a slightly positive fulcrum test.  Negative hop test.  Neurovascularly intact distally.     Assessment & Plan:   Left femur pain-rule out femoral neck stress fracture  Her history of prior stress fracture coupled with the fact that she is on Evenity for osteoporosis which has a known side effect of atypical femoral fractures warrants an x-ray and an MRI.  We will start with an x-ray today of the left femur and, if unremarkable, proceed with MRI specifically to rule out stress injury or atypical stress fracture secondary to Evenity.  I will MyChart message her with those results when available.  In the meantime, she will refrain from any sort of impact exercise including running.  Addendum: X-rays reviewed. X-rays of the left femur shows some cortical thickening in the area of the midshaft which could suggest a stress fracture in this area.  Proceed with MRI  to evaluate further.     This note was dictated using Dragon naturally speaking software and may contain errors in syntax, spelling, or content which have not been identified prior to signing this note.

## 2023-06-09 DIAGNOSIS — M25552 Pain in left hip: Secondary | ICD-10-CM | POA: Diagnosis not present

## 2023-06-09 DIAGNOSIS — M1612 Unilateral primary osteoarthritis, left hip: Secondary | ICD-10-CM | POA: Diagnosis not present

## 2023-06-09 DIAGNOSIS — M7062 Trochanteric bursitis, left hip: Secondary | ICD-10-CM | POA: Diagnosis not present

## 2023-06-09 DIAGNOSIS — M79659 Pain in unspecified thigh: Secondary | ICD-10-CM | POA: Diagnosis not present

## 2023-06-12 NOTE — Telephone Encounter (Signed)
I spoke with Jacqueline Michael on the phone today after reviewing MRI findings of her left femur.  There is no evidence of femoral stress fracture.  She does have mild to moderate hip osteoarthritis which is likely her source of pain.  She is reassured of these findings and may continue with all activity and exercise using pain as her guide.  Follow-up as needed.  This note was dictated using Dragon naturally speaking software and may contain errors in syntax, spelling, or content which have not been identified prior to signing this note.

## 2023-07-03 ENCOUNTER — Ambulatory Visit: Payer: BC Managed Care – PPO | Admitting: Family Medicine

## 2023-07-03 DIAGNOSIS — M810 Age-related osteoporosis without current pathological fracture: Secondary | ICD-10-CM

## 2023-07-03 MED ORDER — ROMOSOZUMAB-AQQG 105 MG/1.17ML ~~LOC~~ SOSY
210.0000 mg | PREFILLED_SYRINGE | Freq: Once | SUBCUTANEOUS | Status: AC
  Administered 2023-07-03: 210 mg via SUBCUTANEOUS

## 2023-07-03 NOTE — Progress Notes (Signed)
Patient is here for evenity injection #9. Patient received bilateral arm [place] evenity injections today. She tolerated injections well. She will return in 1 month for her next evenity injection

## 2023-08-06 ENCOUNTER — Ambulatory Visit (INDEPENDENT_AMBULATORY_CARE_PROVIDER_SITE_OTHER): Payer: BC Managed Care – PPO | Admitting: *Deleted

## 2023-08-06 VITALS — Ht 66.0 in

## 2023-08-06 DIAGNOSIS — M810 Age-related osteoporosis without current pathological fracture: Secondary | ICD-10-CM | POA: Diagnosis not present

## 2023-08-06 MED ORDER — ROMOSOZUMAB-AQQG 105 MG/1.17ML ~~LOC~~ SOSY
210.0000 mg | PREFILLED_SYRINGE | Freq: Once | SUBCUTANEOUS | Status: AC
  Administered 2023-08-06: 210 mg via SUBCUTANEOUS

## 2023-08-06 NOTE — Progress Notes (Signed)
Patient is here for evenity injection #10 . Patient received bilateral arm [place] evenity injections today. She tolerated injections well. She will return in 1 month for her next evenity injection.

## 2023-08-07 ENCOUNTER — Ambulatory Visit: Payer: BC Managed Care – PPO | Admitting: Family Medicine

## 2023-08-11 NOTE — Telephone Encounter (Signed)
 VOB pending

## 2023-08-20 NOTE — Telephone Encounter (Addendum)
 Patient is ready for scheduling on or after:09/09/22 BUY AND BILL  Out-of-pocket cost due at time of visit: $25 with Amgen copay card  [place] [place] Even[place]  co-insurance: 30% Admin fee co-insurance: 30%  Deductible: $3500 ($0 met) OOP max: $6000 ($0 met)  Prior Auth: Required and on file.  PA #: [id number]  Valid: 04/02/23 -04/01/24   ** This summary of benefits is an estimation of the patient's out-of-pocket cost. Exact cost may vary based on individual plan coverage.

## 2023-08-23 NOTE — Telephone Encounter (Signed)
Evenity Injection Schedule (Amgen Copay Assist) Inj #1 - 09/05/22 Inj #2 - 10/06/22 Inj #3 - 11/09/22 Inj #4 - 12/11/22 Inj #5 - 01/12/23 Inj #6 - 02/13/23 Inj #7 - 04/27/23 Inj #8 - 05/29/23 Inj #9 - 07/03/23 Inj #10 - 08/06/23   2025 VOB initiated   Inj #11 - scheduled 2/3/225 Inj #12      PA# [id number] Valid: 04/02/23-04/01/24  Enrolled in [employer] Copay Assistance Program

## 2023-08-23 NOTE — Telephone Encounter (Signed)
Amgen Copay Enrollment complete for 2025  Max benefit $8,000 Available benefit $8,000

## 2023-09-10 ENCOUNTER — Ambulatory Visit (INDEPENDENT_AMBULATORY_CARE_PROVIDER_SITE_OTHER): Payer: BC Managed Care – PPO | Admitting: *Deleted

## 2023-09-10 DIAGNOSIS — M810 Age-related osteoporosis without current pathological fracture: Secondary | ICD-10-CM

## 2023-09-10 MED ORDER — ROMOSOZUMAB-AQQG 105 MG/1.17ML ~~LOC~~ SOSY
210.0000 mg | PREFILLED_SYRINGE | Freq: Once | SUBCUTANEOUS | Status: AC
  Administered 2023-09-10: 210 mg via SUBCUTANEOUS

## 2023-09-10 NOTE — Progress Notes (Signed)
 Patient is here for evenity injection #11. Patient received bilateral arm [place] evenity injections today. She tolerated injections well. She will return in 1 month for her next evenity injection.

## 2023-10-16 ENCOUNTER — Ambulatory Visit: Payer: BC Managed Care – PPO | Admitting: Family Medicine

## 2023-10-16 DIAGNOSIS — M810 Age-related osteoporosis without current pathological fracture: Secondary | ICD-10-CM | POA: Diagnosis not present

## 2023-10-16 MED ORDER — ROMOSOZUMAB-AQQG 105 MG/1.17ML ~~LOC~~ SOSY
210.0000 mg | PREFILLED_SYRINGE | Freq: Once | SUBCUTANEOUS | Status: AC
  Administered 2023-10-16: 210 mg via SUBCUTANEOUS

## 2023-10-16 NOTE — Progress Notes (Unsigned)
 Patient is here for evenity injection #12. Patient received bilateral arm  evenity injections today. She tolerated injections well. She will return in 1 month to discussion next therapy options.  She will also get her bone density done before her next visit.

## 2023-10-31 DIAGNOSIS — M810 Age-related osteoporosis without current pathological fracture: Secondary | ICD-10-CM | POA: Diagnosis not present

## 2023-10-31 DIAGNOSIS — Z780 Asymptomatic menopausal state: Secondary | ICD-10-CM | POA: Diagnosis not present

## 2023-10-31 LAB — HM DEXA SCAN

## 2023-11-14 DIAGNOSIS — N951 Menopausal and female climacteric states: Secondary | ICD-10-CM | POA: Diagnosis not present

## 2023-11-26 ENCOUNTER — Encounter: Payer: Self-pay | Admitting: Family Medicine

## 2023-11-26 ENCOUNTER — Telehealth (INDEPENDENT_AMBULATORY_CARE_PROVIDER_SITE_OTHER): Admitting: Family Medicine

## 2023-11-26 DIAGNOSIS — M810 Age-related osteoporosis without current pathological fracture: Secondary | ICD-10-CM

## 2023-11-26 NOTE — Progress Notes (Signed)
 Virtual Visit via Video Note  I connected with Jacqueline Michael on 11/26/23 at  4:00 PM EDT by a video enabled telemedicine application and verified that I am speaking with the correct person using two identifiers.  Location: Patient: work Provider: office   I discussed the limitations of evaluation and management by telemedicine and the availability of in person appointments. The patient expressed understanding and agreed to proceed.  History of Present Illness: 53 yo F with history of osteoporosis - noted history of right femoral neck stress fracture as well as stress reaction of the sacrum.  She has history of hyperparathyroidism with surgical removal.  Also with partial dairy allergy.  Started HRT about 1 year ago.  Very physically active including jumps, trail running, weight lifting.  She has completed evenity  x 12 months and virtual visit today to discuss bone density results and next steps.     Observations/Objective: Gen: NAD  Dexa 10/31/23: noted improvement of Spine density of 0.070 g/cm2 and L total hip of 0.043 g/cm2 - full results to be scanned into chart.  Assessment and Plan: 53 yo F with osteoporosis - completed anabolic treatment with evenity  with excellent results in both spine and hip measures.  Will look into prolia for maintenance therapy; worst T score now -2.3 at lumbar spine but with history of hyperparathyroidism, hip stress fracture and sacral stress reaction believe she should be on maintenance therapy long term.    I discussed the assessment and treatment plan with the patient. The patient was provided an opportunity to ask questions and all were answered. The patient agreed with the plan and demonstrated an understanding of the instructions.   The patient was advised to call back or seek an in-person evaluation if the symptoms worsen or if the condition fails to improve as anticipated.  I provided 12 minutes of non-face-to-face time during this  encounter.   Rendell Carrel, MD

## 2023-11-28 ENCOUNTER — Telehealth: Payer: Self-pay | Admitting: *Deleted

## 2023-11-28 NOTE — Telephone Encounter (Addendum)
 Patient is ready for scheduling on or after: 12/18/23 BUY AND BILL  Out-of-pocket cost due at time of visit: $525.56 (or  $25 if using Amgen copay card.)  Primary: [place] co-insurance: 30% (approximately $500.56) Admin fee co-insurance: 30% (approximately $25)  Deductible: $7000 ( met $3577.27)   Prior Auth: Approved Auth #: [id number] Valid: 12/17/23 to 12/16/24 PA phone #: [phone]     ** This summary of benefits is an estimation of the patient's out-of-pocket cost. Exact cost may vary based on individual plan coverage.          [place]rolia VOB initiated via [email or web address]. Benefits still pending.

## 2023-11-29 DIAGNOSIS — Z1231 Encounter for screening mammogram for malignant neoplasm of breast: Secondary | ICD-10-CM | POA: Diagnosis not present

## 2023-12-19 ENCOUNTER — Other Ambulatory Visit: Payer: Self-pay | Admitting: *Deleted

## 2023-12-19 DIAGNOSIS — M810 Age-related osteoporosis without current pathological fracture: Secondary | ICD-10-CM

## 2023-12-20 DIAGNOSIS — M810 Age-related osteoporosis without current pathological fracture: Secondary | ICD-10-CM | POA: Diagnosis not present

## 2023-12-21 LAB — BASIC METABOLIC PANEL WITH GFR
BUN/Creatinine Ratio: 26 — ABNORMAL HIGH (ref 9–23)
BUN: 21 mg/dL (ref 6–24)
CO2: 21 mmol/L (ref 20–29)
Calcium: 10.1 mg/dL (ref 8.7–10.2)
Chloride: 101 mmol/L (ref 96–106)
Creatinine, Ser: 0.8 mg/dL (ref 0.57–1.00)
Glucose: 98 mg/dL (ref 70–99)
Potassium: 4.3 mmol/L (ref 3.5–5.2)
Sodium: 139 mmol/L (ref 134–144)
eGFR: 89 mL/min/{1.73_m2} (ref >59)

## 2023-12-21 LAB — VITAMIN D 25 HYDROXY (VIT D DEFICIENCY, FRACTURES): Vit D, 25-Hydroxy: 37.9 ng/mL (ref 30.0–100.0)

## 2023-12-22 ENCOUNTER — Ambulatory Visit: Payer: Self-pay | Admitting: Family Medicine

## 2023-12-25 ENCOUNTER — Ambulatory Visit: Admitting: Family Medicine

## 2023-12-25 DIAGNOSIS — M810 Age-related osteoporosis without current pathological fracture: Secondary | ICD-10-CM

## 2023-12-25 DIAGNOSIS — Z6823 Body mass index (BMI) 23.0-23.9, adult: Secondary | ICD-10-CM | POA: Diagnosis not present

## 2023-12-25 DIAGNOSIS — N951 Menopausal and female climacteric states: Secondary | ICD-10-CM | POA: Diagnosis not present

## 2023-12-25 DIAGNOSIS — Z1331 Encounter for screening for depression: Secondary | ICD-10-CM | POA: Diagnosis not present

## 2023-12-25 DIAGNOSIS — Z01411 Encounter for gynecological examination (general) (routine) with abnormal findings: Secondary | ICD-10-CM | POA: Diagnosis not present

## 2023-12-25 MED ORDER — DENOSUMAB 60 MG/ML ~~LOC~~ SOSY
60.0000 mg | PREFILLED_SYRINGE | Freq: Once | SUBCUTANEOUS | Status: AC
Start: 2023-12-25 — End: 2023-12-25
  Administered 2023-12-25: 60 mg via SUBCUTANEOUS

## 2023-12-26 NOTE — Progress Notes (Signed)
Patient given [place] prolia injection 60mg/ml in her left arm. Patient tolerated injection well without reaction at the injection site. Patient will schedule next injection, which is 6 months from today.  

## 2024-01-07 ENCOUNTER — Ambulatory Visit (INDEPENDENT_AMBULATORY_CARE_PROVIDER_SITE_OTHER): Admitting: Orthopedic Surgery

## 2024-01-07 ENCOUNTER — Other Ambulatory Visit (INDEPENDENT_AMBULATORY_CARE_PROVIDER_SITE_OTHER)

## 2024-01-07 DIAGNOSIS — S52572A Other intraarticular fracture of lower end of left radius, initial encounter for closed fracture: Secondary | ICD-10-CM

## 2024-01-07 DIAGNOSIS — M25532 Pain in left wrist: Secondary | ICD-10-CM

## 2024-01-07 NOTE — Progress Notes (Signed)
 Jacqueline Michael - 53 y.o. female MRN [id number]  Date of birth: 05/18/1971  Office Visit Note: Visit Date: 01/07/2024 PCP: Vinie Greenland, MD Referred by: Vinie Greenland, MD  Subjective: No chief complaint on file.  HPI: Jacqueline Michael is a pleasant 53 y.o. female who presents today for evaluation of a left wrist injury sustained approximately 3 days prior.  She was [place] a foreign [place], sustained a mechanical fall onto the outstretched left wrist with notable pain and deformity.  She was seen in a local emergency department setting, underwent clinical radiographic workup which showed a left wrist distal radius fracture with intra-articular extension, comminution and notable displacement.  Was also found to have small ulnar styloid fracture as well.  She underwent bedside closed reduction and splinting at that time.  There is noted to be some superficial abrasions of the skin, no protruding bone was documented.  She has since traveled back to [place]  over the weekend, presents today for evaluation.  Pain is controlled at rest, denies any significant numbness or tingling.  Pertinent ROS were reviewed with the patient and found to be negative unless otherwise specified above in HPI.   Visit Reason: left wrist fracture Duration of symptoms: 01/03/24 Hand dominance: right Occupation: Consultant Diabetic: No Smoking: No Heart/Lung History: none Blood Thinners: none  Prior Testing/EMG: 01/03/24 Injections (Date): none Treatments: splint Prior Surgery:none  Assessment & Plan: Visit Diagnoses:  1. Other closed intra-articular fracture of distal end of left radius, initial encounter   2. Pain in left wrist     Plan: Extensive discussion was had with the patient today regarding her left distal radius fracture and associated ulnar styloid fracture.  We discussed the displaced and intra-articular nature of injury at the distal radius and the associated comminution.  We discussed  treatment modalities ranging from conservative to surgical.  From a conservative standpoint, we discussed ongoing immobilization to allow for ongoing healing in the position it is currently in, however I explained that given the displacement, angulation and shortening of the injury, this would predispose to potential loss of range of motion and function at the wrist level.  From a surgical standpoint, we discussed open reduction internal fixation in order to restore the anatomy, promote healing and early mobilization of the wrist, I also discussed the possibility for ulnar styloid fracture fixation and stabilization of the distal radial ulnar joint as needed.   The benefits of this procedure would be to promote fracture healing by providing stability and to heal the fracture in the appropriate alignment. The alternatives of this surgery would be to treat the fracture with immobilization in a splint/brace/cast or to do no intervention. The patient's questions were answered to satisfaction. After this discussion, patient elected to proceed with surgery. Informed consent was obtained.    Risks and benefits of the procedure were discussed, risks including but not limited to infection, bleeding, scarring, stiffness, nerve injury, tendon injury, vascular injury, hardware complication, recurrence of symptoms and need for subsequent operation.  Patient expressed understanding.  We also discussed the appropriate postoperative treatment protocol as well as appropriate return to activities.  I did emphasize importance of occupational therapy postoperatively as well.    We will move forward with surgical scheduling of left wrist distal radius fracture open reduction internal fixation, possible ulnar styloid fracture fixation and possible distal radial ulnar joint stabilization at the next available date, surgery will be performed this week.    Follow-up: No follow-ups on file.   Meds &  Orders: No orders of the defined  types were placed in this encounter.   Orders Placed This Encounter  Procedures   XR Wrist Complete Left   XR Elbow Complete Left (3+View)     Procedures: No procedures performed      Clinical History: No specialty comments available.  She reports that she has never smoked. She has never used smokeless tobacco. No results for input(s): "HGBA1C", "LABURIC" in the last 8760 hours.  Objective:   Vital Signs: LMP 06/13/2016   Physical Exam  Gen: Well-appearing, in no acute distress; non-toxic CV: Regular Rate. Well-perfused. Warm.  Resp: Breathing unlabored on room air; no wheezing. Psych: Fluid speech in conversation; appropriate affect; normal thought process  Ortho Exam Left wrist: - Skin is intact, superficial abrasions notable to the volar aspect of the wrist without active bleeding, minimal nature, less than 1 cm in length - Digital range of motion is preserved, AIN/PIN/interosseous intact - Sensation intact in median/radial/ulnar distributions - Hand mains warm well-perfused - No tenderness about the elbow, able to perform range of motion of the elbow with flexion and extension without significant restriction or pain  Imaging: XR Elbow Complete Left (3+View) Result Date: 01/07/2024 There is no evidence of fracture or dislocation. There is no evidence of arthropathy or other focal bone abnormality. Soft tissues are unremarkable.   XR Wrist Complete Left Result Date: 01/07/2024 X-rays of the left wrist demonstrate distal radius fracture with notable comminution, intra-articular involvement with notable displacement and dorsal angulation.  There is also an associated ulnar styloid fracture.   Past Medical/Family/Surgical/Social History: Medications & Allergies reviewed per EMR, new medications updated. Patient Active Problem List   Diagnosis Date Noted   OP (osteoporosis) 12/11/2022   Insufficiency fracture of tibia 08/30/2022   Nondisplaced fracture of lateral malleolus  of left fibula, subsequent encounter for closed fracture with routine healing 08/14/2022   Osteoporosis 05/16/2021   Right knee pain 01/04/2021   Closed left cuboid fracture 11/15/2017   Hamstring tendonitis at origin 01/28/2014   HIP PAIN, RIGHT 04/27/2009   FOOT PAIN, BILATERAL 04/27/2009   BUNION, RIGHT FOOT 12/31/2007   Closed fracture of sacrum and coccyx (HCC) 12/31/2007   No past medical history on file. No family history on file. No past surgical history on file. Social History   Occupational History   Not on file  Tobacco Use   Smoking status: Never   Smokeless tobacco: Never  Substance and Sexual Activity   Alcohol use: Not on file   Drug use: Not on file   Sexual activity: Not on file    Javanna Patin Merlinda Starling) Marce Sensing, M.D. [facility], Hand Surgery

## 2024-01-10 ENCOUNTER — Other Ambulatory Visit: Payer: Self-pay | Admitting: Orthopedic Surgery

## 2024-01-10 DIAGNOSIS — W19XXXA Unspecified fall, initial encounter: Secondary | ICD-10-CM | POA: Diagnosis not present

## 2024-01-10 DIAGNOSIS — Y929 Unspecified place or not applicable: Secondary | ICD-10-CM | POA: Diagnosis not present

## 2024-01-10 DIAGNOSIS — G8918 Other acute postprocedural pain: Secondary | ICD-10-CM | POA: Diagnosis not present

## 2024-01-10 DIAGNOSIS — Z9889 Other specified postprocedural states: Secondary | ICD-10-CM | POA: Diagnosis not present

## 2024-01-10 DIAGNOSIS — S52502A Unspecified fracture of the lower end of left radius, initial encounter for closed fracture: Secondary | ICD-10-CM | POA: Diagnosis not present

## 2024-01-10 DIAGNOSIS — S52572A Other intraarticular fracture of lower end of left radius, initial encounter for closed fracture: Secondary | ICD-10-CM | POA: Diagnosis not present

## 2024-01-10 MED ORDER — METHOCARBAMOL 500 MG PO TABS: 500.0000 mg | ORAL_TABLET | Freq: Three times a day (TID) | ORAL | 0 refills | Status: AC

## 2024-01-10 MED ORDER — ONDANSETRON HCL 4 MG PO TABS: 4.0000 mg | ORAL_TABLET | Freq: Three times a day (TID) | ORAL | 0 refills | Status: AC | PRN

## 2024-01-10 MED ORDER — OXYCODONE HCL 5 MG PO TABS: 5.0000 mg | ORAL_TABLET | Freq: Four times a day (QID) | ORAL | 0 refills | Status: AC | PRN

## 2024-01-22 NOTE — Therapy (Signed)
 OUTPATIENT OCCUPATIONAL THERAPY ORTHO EVALUATION  Patient Name: Jacqueline Michael MRN: [id number] DOB:1971-02-03, 53 y.o., female Today's Date: 01/24/2024  PCP: Darcia Easter MD REFERRING PROVIDER: Dr Marce Sensing   END OF SESSION:  OT End of Session - 01/24/24 0921     Visit Number 1    Number of Visits 12    Date for OT Re-Evaluation 03/21/24    Authorization Type [employer]    OT Start Time [phone]    OT Stop Time 1019    OT Time Calculation (min) 58 min    Equipment Utilized During Treatment orthotic materials    Activity Tolerance Patient tolerated treatment well;Patient limited by pain;No increased pain;Patient limited by fatigue    Behavior During Therapy [facility] for tasks assessed/performed          History reviewed. No pertinent past medical history. History reviewed. No pertinent surgical history. Patient Active Problem List   Diagnosis Date Noted   OP (osteoporosis) 12/11/2022   Insufficiency fracture of tibia 08/30/2022   Nondisplaced fracture of lateral malleolus of left fibula, subsequent encounter for closed fracture with routine healing 08/14/2022   Osteoporosis 05/16/2021   Right knee pain 01/04/2021   Closed left cuboid fracture 11/15/2017   Hamstring tendonitis at origin 01/28/2014   HIP PAIN, RIGHT 04/27/2009   FOOT PAIN, BILATERAL 04/27/2009   BUNION, RIGHT FOOT 12/31/2007   Closed fracture of sacrum and coccyx (HCC) 12/31/2007    ONSET DATE: 01/04/24 DOI  REFERRING DIAG: J19.147 (ICD-10-CM) - Pain in left wrist   THERAPY DIAG:  Muscle weakness (generalized)  Localized edema  Pain in left wrist  Other lack of coordination  Stiffness of left wrist, not elsewhere classified  Stiffness of left hand, not elsewhere classified  Rationale for Evaluation and Treatment: Rehabilitation  SUBJECTIVE:   SUBJECTIVE STATEMENT: She is now 2 weeks s/p L DRF, ulnar styloid fxs.  She states she slipped and fell in a wet spa in [place] and flew home on an emergency basis,  after the fracture was set and casted.  Very adventurous person with hiking, climbing, etc. She has been walking and staying active.  Her hand and fingers look very stiff today at evaluation.  Per the doctor, her wrist was very badly damaged and this injury and therapy should proceed gently.   PERTINENT HISTORY: left wrist distal radius fracture with intra-articular extension, comminution and notable displacement. Was also found to have small ulnar styloid fracture as well. She underwent bedside closed reduction and splinting at that time.   PRECAUTIONS: None  RED FLAGS: None   WEIGHT BEARING RESTRICTIONS: Yes no significant weightbearing in the left hand and arm for the next 4 weeks  PAIN:  Are you having pain? Yes: NPRS scale: 1/10 at rest and up to 6-7/10 at worst in past week  Pain location: Left wrist Pain description: Aching Aggravating factors: Attempted movement Relieving factors: Rest and ice  FALLS: Has patient fallen in last 6 months? Yes. Number of falls 1 (this accident, not a fall risk)  LIVING ENVIRONMENT: Lives with: lives with their family Lives in: House/apartment Has following equipment at home: None  PLOF: Independent  PATIENT GOALS: To quickly return to full functional ability for her many planned trips and adventures  NEXT MD VISIT: 02/11/2024   OBJECTIVE: (All objective assessments below are from initial evaluation on: 01/24/24 unless otherwise specified.)   HAND DOMINANCE: Right   ADLs: Overall ADLs: States decreased ability to grab, hold household objects, pain and difficulty to open containers, perform  FMS tasks (manipulate fasteners on clothing), mild to moderate bathing problems as well.    FUNCTIONAL OUTCOME MEASURES: Eval: Patient Specific Functional Scale: 0.5 (typing, hair brush, holding wheel or eliptical)  (Higher Score  =  Better Ability for the Selected Tasks)      UPPER EXTREMITY ROM     Shoulder to Wrist AROM Left eval  Elbow flexion  153  Elbow extension 0  Forearm supination 59  Forearm pronation  10  Wrist flexion 12  Wrist extension 3  Wrist ulnar deviation 2  Wrist radial deviation 9  Functional dart thrower's motion (F-DTM) in ulnar flexion   F-DTM in radial extension    (Blank rows = not tested)   Hand AROM Left eval  Full Fist Ability (or Gap to Distal Palmar Crease) 7cm gap from tip of MF to [facility]  Thumb Opposition  (Kapandji Scale)  6/10  Thumb MCP (0-60)   Thumb IP (0-80)   Thumb Radial Abduction Span    Thumb Palmar Abduction Span    Index MCP (0-90)    Index PIP (0-100)    Index DIP (0-70)    Long MCP (0-90)    Long PIP (0-100)    Long DIP (0-70)    Ring MCP (0-90)    Ring PIP (0-100)    Ring DIP (0-70)    Little MCP (0-90)    Little PIP (0-100)    Little DIP (0-70)    (Blank rows = not tested)   UPPER EXTREMITY MMT:    Eval:  NT at eval due to recent and still healing injuries, but grossly 3 -/5 MMT in the left wrist and hand today. Will be tested when appropriate.   MMT Left TBD  Shoulder flexion   Shoulder abduction   Shoulder adduction   Shoulder extension   Shoulder internal rotation   Shoulder external rotation   Middle trapezius   Lower trapezius   Elbow flexion   Elbow extension   Forearm supination   Forearm pronation   Wrist flexion   Wrist extension   Wrist ulnar deviation   Wrist radial deviation   (Blank rows = not tested)  HAND FUNCTION: Eval: Observed weakness in affected left hand.  Details TBD when safe Grip strength Right: TBD lbs, Left: TBD lbs   COORDINATION: Eval: Observed coordination impairments with affected Lt hand. 9 Hole Peg Test Left: 47 sec (25 sec is WFL)   SENSATION: Eval:  Light touch intact today, though mildly diminished/hypersensitive around sx area    EDEMA:   Eval:  Mildly swollen in left hand and wrist today  COGNITION: Eval: Overall cognitive status: WFL for evaluation today   OBSERVATIONS:   Eval: Surgical site clean  and covered by Steri-Strips,  She has some nervous guarding behaviors today, hand and thumb were very stiff.    TODAY'S TREATMENT:  Post-evaluation treatment:   For safety/self-care she was recommended to do no weightbearing of significance in the left hand and arm now for the next 4 weeks at least.  She is allowed to do light fine motor skill activities and functional tasks.  She should rub and touch her surgical area for desensitization and in 3 to 4 days can begin gentle scar mobilizations.  She was urged to keep her hand moving to prevent stiffness and scar adhesions.  She was given compressive Tensogrip to help with edema and swelling and advised to not let her arm hang down which will increase swelling.  Custom orthotic fabrication  was indicated due to pt's healing left distal radius fracture and subsequent ORIF surgery and need for safe, functional positioning. OT fabricated custom left forearm-based wrist cock up orthosis for pt today to immobilize the left wrist and protected. It fit well with no areas of pressure, pt states a comfortable fit. Pt was educated on the wearing schedule (on at all times except for hygiene and exercises), to avoid exposing it to sources of heat, to wipe clean as needed (do not wash, use harsh detergents), to call or come in ASAP if it is causing any irritation or is not achieving desired function. It will be checked/adjusted in upcoming sessions, as needed. Pt states understanding all directions.    She was also given the following home exercise program to perform 4-6 times a day as tolerated, in a nonpainful fashion, slow and smooth.  Each of these were shown to her and she states understanding.  They will need reviewed in upcoming sessions.    Exercises - Standing Shoulder Flexion Full Range  - 3-4 x daily - 1-2 sets - 10-15 reps - Bend and Straighten Elbow  - 3-4 x daily - 1-2 sets - 10-15 reps - Palm Up / Palm Down  - 3-4 x daily - 1-2 sets - 10-15 reps -  Bend and Pull Back Wrist SLOWLY  - 3-4 x daily - 1-2 sets - 10-15 reps - Windshield Wipers   - 3-4 x daily - 1-2 sets - 10-15 reps - Wrist AROM Dart Throwers Motion  - 3-4 x daily - 1-2 sets - 10-15 reps - BACK KNUCKLE STRETCHES   - 4 x daily - 3-5 reps - 15 sec hold - HOOK Stretch  - 4 x daily - 3-5 reps - 15-20 sec hold - Seated Finger Composite Flexion Stretch  - 4 x daily - 3-5 reps - 15 hold - Thumb stretch  - 4 x daily - 3-5 reps - 15-20 sec hold - PUSH KNUCKLES DOWN  - 4 x daily - 3-5 reps - 15 seconds hold - Tendon Glides  - 3-4 x daily - 3-5 reps - 2-3 seconds hold - Touch Thumb to [facility] Finger  - 3-4 x daily - 1-2 sets - 10 reps  Patient Education - Scar Massage   PATIENT EDUCATION: Education details: See tx section above for details  Person educated: Patient Education method: Verbal Instruction, Teach back, Handouts  Education comprehension: States and demonstrates understanding, Additional Education required    HOME EXERCISE PROGRAM: Access Code: [email or web address] URL: [email or web address] Date: 01/24/2024 Prepared by: Leartis Proud   GOALS: Goals reviewed with patient? Yes   SHORT TERM GOALS: (STG required if POC>30 days) Target Date: 02/08/24  Pt will obtain protective, custom orthotic. Goal status: 01/24/24: MET  2.  Pt will demo/state understanding of initial HEP to improve pain levels and prerequisite motion. Goal status: INITIAL   LONG TERM GOALS: Target Date: 03/21/24  Pt will improve functional ability by decreased impairment per PSFS assessment from 0.5 to 6.5 or better, for better quality of life. Goal status: INITIAL  2.  Pt will improve grip strength in Lt hand from unsafe to test to at least 40lbs for functional use at home and in IADLs. Goal status: INITIAL  3.  Pt will improve A/ROM in Lt wrist flex/ext from 12/3 degrees respectively, to at least 55* each, to have functional motion for tasks like reach and grasp.  Goal status:  INITIAL  4.  Pt will improve strength in Lt hand/arm  from apparent 3-/5 MMT to at least 4+/5 MMT to have increased functional ability to carry out selfcare and higher-level homecare tasks with less difficulty. Goal status: INITIAL  5.  Pt will improve coordination skills in Lt hand/arm, as seen by within functional limit score on 9HPT testing to have increased functional ability to carry out fine motor tasks (fasteners, etc.) and more complex, coordinated IADLs (meal prep, sports, etc.).  Goal status: INITIAL  6.  Pt will decrease pain at worst from 6-7/10 to 3/10 or better to have better sleep and occupational participation in daily roles. Goal status: INITIAL   ASSESSMENT:  CLINICAL IMPRESSION: Patient is a 53 y.o. female who was seen today for occupational therapy evaluation for swelling, weakness, stiffness, guarding, decreased functional ability in the left hand and wrist after distal radius fracture and subsequent ORIF surgery.  The patient will benefit from outpatient occupational therapy to decrease symptoms, improve functional upper extremity use, and increase quality of life.  PERFORMANCE DEFICITS: in functional skills including ADLs, IADLs, coordination, dexterity, edema, ROM, strength, pain, fascial restrictions, flexibility, Fine motor control, Gross motor control, body mechanics, endurance, decreased knowledge of precautions, wound, and UE functional use, cognitive skills including problem solving and safety awareness, and psychosocial skills including coping strategies, environmental adaptation, habits, and routines and behaviors.    IMPAIRMENTS: are limiting patient from ADLs, IADLs, rest and sleep, and leisure.    COMORBIDITIES: may have co-morbidities  that affects occupational performance. Patient will benefit from skilled OT to address above impairments and improve overall function.   MODIFICATION OR ASSISTANCE TO COMPLETE EVALUATION: No modification of tasks or assist  necessary to complete an evaluation.   OT OCCUPATIONAL PROFILE AND HISTORY: Problem focused assessment: Including review of records relating to presenting problem.   CLINICAL DECISION MAKING: Moderate - several treatment options, min-mod task modification necessary   REHAB POTENTIAL: Excellent   EVALUATION COMPLEXITY: Low      PLAN:  OT FREQUENCY: 1-2x/week  OT DURATION: 8 weeks through 03/21/24 and up to 12 total visits as needed   PLANNED INTERVENTIONS: 97535 self care/ADL training, 16109 therapeutic exercise, 97530 therapeutic activity, 97112 neuromuscular re-education, 97140 manual therapy, 97035 ultrasound, 97032 electrical stimulation (manual), 97760 Orthotic Initial, [id number] Orthotic/Prosthetic subsequent, compression bandaging, Dry needling, energy conservation, coping strategies training, and patient/family education  RECOMMENDED OTHER SERVICES: none now    CONSULTED AND AGREED WITH PLAN OF CARE: Patient  PLAN FOR NEXT SESSION:   Review initial HEP and recommendations, check and modify orthosis as needed, upgrade to light stretches at the wrist as tolerated, perform manual therapy as helpful   Leartis Proud, OTR/L, CHT  01/24/2024, 2:16 PM

## 2024-01-23 ENCOUNTER — Other Ambulatory Visit: Payer: Self-pay

## 2024-01-23 DIAGNOSIS — M25532 Pain in left wrist: Secondary | ICD-10-CM

## 2024-01-23 NOTE — Progress Notes (Unsigned)
   Jacqueline Michael - 53 y.o. female MRN [id number]  Date of birth: Aug 18, 1970  Office Visit Note: Visit Date: 01/24/2024 PCP: Vinie Greenland, MD Referred by: Vinie Greenland, MD  Subjective:  HPI: Jacqueline Michael is a 53 y.o. female who presents today for follow up 2 weeks status post open reduction internal fixation of left distal radius fracture.  Pertinent ROS were reviewed with the patient and found to be negative unless otherwise specified above in HPI.   Assessment & Plan: Visit Diagnoses: No diagnosis found.  Plan: ***  Follow-up: No follow-ups on file.   Meds & Orders: No orders of the defined types were placed in this encounter.  No orders of the defined types were placed in this encounter.    Procedures: No procedures performed       Objective:   Vital Signs: LMP 06/13/2016   Ortho Exam ***  Imaging: No results found.   Gilliam Hawkes Alvia Jointer, M.D. [facility], Hand Surgery

## 2024-01-24 ENCOUNTER — Ambulatory Visit: Admitting: Orthopedic Surgery

## 2024-01-24 ENCOUNTER — Ambulatory Visit (INDEPENDENT_AMBULATORY_CARE_PROVIDER_SITE_OTHER): Payer: Self-pay

## 2024-01-24 ENCOUNTER — Ambulatory Visit (INDEPENDENT_AMBULATORY_CARE_PROVIDER_SITE_OTHER): Admitting: Rehabilitative and Restorative Service Providers"

## 2024-01-24 ENCOUNTER — Encounter: Payer: Self-pay | Admitting: Rehabilitative and Restorative Service Providers"

## 2024-01-24 DIAGNOSIS — M25532 Pain in left wrist: Secondary | ICD-10-CM

## 2024-01-24 DIAGNOSIS — Z9889 Other specified postprocedural states: Secondary | ICD-10-CM

## 2024-01-24 DIAGNOSIS — Z8781 Personal history of (healed) traumatic fracture: Secondary | ICD-10-CM

## 2024-01-24 DIAGNOSIS — M6281 Muscle weakness (generalized): Secondary | ICD-10-CM

## 2024-01-24 DIAGNOSIS — R600 Localized edema: Secondary | ICD-10-CM

## 2024-01-24 DIAGNOSIS — M25632 Stiffness of left wrist, not elsewhere classified: Secondary | ICD-10-CM

## 2024-01-24 DIAGNOSIS — M25642 Stiffness of left hand, not elsewhere classified: Secondary | ICD-10-CM

## 2024-01-24 DIAGNOSIS — R278 Other lack of coordination: Secondary | ICD-10-CM | POA: Diagnosis not present

## 2024-01-28 NOTE — Therapy (Signed)
 OUTPATIENT OCCUPATIONAL THERAPY TREATMENT NOTE   Patient Name: Jacqueline Michael MRN: [id number] DOB:22-Sep-1970, 53 y.o., female Today's Date: 01/30/2024  PCP: Vannie EDISON MD REFERRING PROVIDER: Dr Arlinda    END OF SESSION:  OT End of Session - 01/30/24 1600     Visit Number 2    Number of Visits 12    Date for OT Re-Evaluation 03/21/24    Authorization Type [employer]    OT Start Time 1600    OT Stop Time 1650    OT Time Calculation (min) 50 min    Activity Tolerance Patient tolerated treatment well;Patient limited by pain;No increased pain;Patient limited by fatigue    Behavior During Therapy [facility] for tasks assessed/performed           History reviewed. No pertinent past medical history. History reviewed. No pertinent surgical history. Patient Active Problem List   Diagnosis Date Noted   OP (osteoporosis) 12/11/2022   Insufficiency fracture of tibia 08/30/2022   Nondisplaced fracture of lateral malleolus of left fibula, subsequent encounter for closed fracture with routine healing 08/14/2022   Osteoporosis 05/16/2021   Right knee pain 01/04/2021   Closed left cuboid fracture 11/15/2017   Hamstring tendonitis at origin 01/28/2014   HIP PAIN, RIGHT 04/27/2009   FOOT PAIN, BILATERAL 04/27/2009   BUNION, RIGHT FOOT 12/31/2007   Closed fracture of sacrum and coccyx (HCC) 12/31/2007    ONSET DATE: 01/10/24 DOS  REFERRING DIAG: F74.467 (ICD-10-CM) - Pain in left wrist   THERAPY DIAG:  Muscle weakness (generalized)  Localized edema  Pain in left wrist  Other lack of coordination  Stiffness of left wrist, not elsewhere classified  Stiffness of left hand, not elsewhere classified  Rationale for Evaluation and Treatment: Rehabilitation  PERTINENT HISTORY: left wrist distal radius fracture with intra-articular extension, comminution and notable displacement. Was also found to have small ulnar styloid fracture as well. She underwent bedside closed reduction and splinting at  that time.  she slipped and fell in a wet spa in [place] and flew home on an emergency basis, after the fracture was set and casted.  Very adventurous person with hiking, climbing, etc. She has been walking and staying active.  Her hand and fingers look very stiff today at evaluation.  Per the doctor, her wrist was very badly damaged and this injury and therapy should proceed gently.  PRECAUTIONS: None  RED FLAGS:  None   WEIGHT BEARING RESTRICTIONS: Yes no significant weightbearing in the left hand and arm for the next 4 weeks   SUBJECTIVE:   SUBJECTIVE STATEMENT: She is now 3 weeks s/p L DRF, ulnar styloid fxs.  She states that she has been stretching but she wants more activities to do because she is very eager to get better.  Her orthosis needs some modification       PAIN:  Are you having pain? Yes: NPRS scale:  1/10 at rest and up to 3-4/10 at worst in past week  Pain location: Left wrist Pain description: Aching Aggravating factors: Attempted movement Relieving factors: Rest and ice   PATIENT GOALS: To quickly return to full functional ability for her many planned trips and adventures  NEXT MD VISIT: 02/11/2024   OBJECTIVE: (All objective assessments below are from initial evaluation on: 01/24/24 unless otherwise specified.)   HAND DOMINANCE: Right   ADLs: Overall ADLs: States decreased ability to grab, hold household objects, pain and difficulty to open containers, perform FMS tasks (manipulate fasteners on clothing), mild to moderate bathing problems as well.  FUNCTIONAL OUTCOME MEASURES: Eval: Patient Specific Functional Scale: 0.5 (typing, hair brush, holding wheel or eliptical)  (Higher Score  =  Better Ability for the Selected Tasks)      UPPER EXTREMITY ROM     Shoulder to Wrist AROM Left eval Lt 01/30/24  Elbow flexion 153   Elbow extension 0   Forearm supination 59 31  Forearm pronation  10 29  Wrist flexion 12 20  Wrist extension 3 15  Wrist ulnar  deviation 2 10  Wrist radial deviation 9 12  Functional dart thrower's motion (F-DTM) in ulnar flexion    F-DTM in radial extension     (Blank rows = not tested)   Hand AROM Left eval Lt 02/01/24  Full Fist Ability (or Gap to Distal Palmar Crease) 7cm gap from tip of MF to [facility] TBD cm gap from tip of MF to [facility]  Thumb Opposition  (Kapandji Scale)  6/10 TBD/10  Thumb MCP (0-60)    Thumb IP (0-80)    Thumb Radial Abduction Span     Thumb Palmar Abduction Span     Index MCP (0-90)     Index PIP (0-100)     Index DIP (0-70)     Long MCP (0-90)     Long PIP (0-100)     Long DIP (0-70)     Ring MCP (0-90)     Ring PIP (0-100)     Ring DIP (0-70)     Little MCP (0-90)     Little PIP (0-100)     Little DIP (0-70)     (Blank rows = not tested)   UPPER EXTREMITY MMT:    Eval:  NT at eval due to recent and still healing injuries, but grossly 3 -/5 MMT in the left wrist and hand today. Will be tested when appropriate.   MMT Left TBD  Shoulder flexion   Shoulder abduction   Shoulder adduction   Shoulder extension   Shoulder internal rotation   Shoulder external rotation   Middle trapezius   Lower trapezius   Elbow flexion   Elbow extension   Forearm supination   Forearm pronation   Wrist flexion   Wrist extension   Wrist ulnar deviation   Wrist radial deviation   (Blank rows = not tested)  HAND FUNCTION: Eval: Observed weakness in affected left hand.  Details TBD when safe Grip strength Right: TBD lbs, Left: TBD lbs   COORDINATION: Eval: Observed coordination impairments with affected Lt hand. 9 Hole Peg Test Left: 47 sec (25 sec is WFL)   SENSATION: Eval:  Light touch intact today, though mildly diminished/hypersensitive around sx area    EDEMA:   Eval:  Mildly swollen in left hand and wrist today   OBSERVATIONS:   01/30/2024: She apparently has some scar adhesion for FDP to the index finger as this finger has a difficulty extending and also difficulty actively  flexing and some scar motion is seen.  Thumb is also mildly stiff and adherent.  This is somewhat shocking at only 3 weeks postop.  It may be due to the severity of her fracture and the delay with fracture to surgery.     Eval: Surgical site clean and covered by Steri-Strips,  She has some nervous guarding behaviors today, hand and thumb were very stiff.    TODAY'S TREATMENT:  01/30/24: She starts with active range of motion for exercise as well as new measures which does show significant improvements.  OT then upgraded  her home exercise program in several ways to address some adherent scarring to the FPL and FDP to the index finger, and also to add in wrist and forearm stretches now at 3 weeks postop.  Again we review safety recommendations including no weightbearing, but OT does recommend leaving off the orthosis when safe at home to do light functional tasks.  She should always sleep in it, always wear it when out of the home or anytime she may be at risk to fall or bear weight.  OT modifies her orthosis to make it more comfortable.  She was told to discharge her shoulder sling as she states she has worn it several times still.  She is told to take stretches to a strong place but avoid sharp or significant pain while doing stretches.  She was also given blue nonslip Dycem to help increase her scar mobilizations.  At the end of the session she states understanding all directions and leaves in only mild pain   Exercises reviewed and performed today (bolded are new) - Forearm Supination Stretch  - 3-4 x daily - 3-5 reps - 15 sec hold - Forearm Pronation Stretch  - 3-4 x daily - 3-5 reps - 15 sec hold - Wrist Extension Stretch Pronated  - 4 x daily - 3-5 reps - 15 hold - Wrist Flexion Stretch  - 4 x daily - 3-5 reps - 15 sec hold - Palm Up / Palm Down  - 3-4 x daily - 1-2 sets - 10-15 reps - Bend and Pull Back Wrist SLOWLY  - 3-4 x daily - 1-2 sets - 10-15 reps - Windshield Wipers   - 3-4 x daily -  1-2 sets - 10-15 reps - Wrist AROM Dart Throwers Motion  - 3-4 x daily - 1-2 sets - 10-15 reps - BACK KNUCKLE STRETCHES   - 4 x daily - 3-5 reps - 15 sec hold - HOOK Stretch  - 4 x daily - 3-5 reps - 15-20 sec hold - Seated Finger Composite Flexion Stretch  - 4 x daily - 3-5 reps - 15 hold - Thumb stretch  - 4 x daily - 3-5 reps - 15-20 sec hold - PUSH KNUCKLES DOWN  - 4 x daily - 3-5 reps - 15 seconds hold - Thumb AROM IP Blocking  - 4-6 x daily - 10-15 reps - Tip Joint Blocking Motion  - 4-6 x daily - 10-15 reps - Tendon Glides  - 3-4 x daily - 3-5 reps - 2-3 seconds hold - Touch Thumb to [facility] Finger  - 3-4 x daily - 1-2 sets - 10 reps Patient Education - Scar Massage    PATIENT EDUCATION: Education details: See tx section above for details  Person educated: Patient Education method: Verbal Instruction, Teach back, Handouts  Education comprehension: States and demonstrates understanding, Additional Education required    HOME EXERCISE PROGRAM: Access Code: [email or web address] URL: [email or web address] Date: 01/24/2024 Prepared by: Melvenia Ada   GOALS: Goals reviewed with patient? Yes   SHORT TERM GOALS: (STG required if POC>30 days) Target Date: 02/08/24  Pt will obtain protective, custom orthotic. Goal status: 01/24/24: MET  2.  Pt will demo/state understanding of initial HEP to improve pain levels and prerequisite motion. Goal status: INITIAL   LONG TERM GOALS: Target Date: 03/21/24  Pt will improve functional ability by decreased impairment per PSFS assessment from 0.5 to 6.5 or better, for better quality of life. Goal status: INITIAL  2.  Pt will improve grip strength in  Lt hand from unsafe to test to at least 40lbs for functional use at home and in IADLs. Goal status: INITIAL  3.  Pt will improve A/ROM in Lt wrist flex/ext from 12/3 degrees respectively, to at least 55* each, to have functional motion for tasks like reach and grasp.  Goal status:  INITIAL  4.  Pt will improve strength in Lt hand/arm from apparent 3-/5 MMT to at least 4+/5 MMT to have increased functional ability to carry out selfcare and higher-level homecare tasks with less difficulty. Goal status: INITIAL  5.  Pt will improve coordination skills in Lt hand/arm, as seen by within functional limit score on 9HPT testing to have increased functional ability to carry out fine motor tasks (fasteners, etc.) and more complex, coordinated IADLs (meal prep, sports, etc.).  Goal status: INITIAL  6.  Pt will decrease pain at worst from 6-7/10 to 3/10 or better to have better sleep and occupational participation in daily roles. Goal status: INITIAL   ASSESSMENT:  CLINICAL IMPRESSION: 01/30/24: She seems to have some unfortunate adherence of FPL and FDP to the index finger, but we are working on it.  She still very stiff but her motion is steadily improving.  Continue on    PLAN:  OT FREQUENCY: 1-2x/week  OT DURATION: 8 weeks through 03/21/24 and up to 12 total visits as needed   PLANNED INTERVENTIONS: 97535 self care/ADL training, 02889 therapeutic exercise, 97530 therapeutic activity, 97112 neuromuscular re-education, 97140 manual therapy, 97035 ultrasound, 97032 electrical stimulation (manual), 97760 Orthotic Initial, [id number] Orthotic/Prosthetic subsequent, compression bandaging, Dry needling, energy conservation, coping strategies training, and patient/family education  RECOMMENDED OTHER SERVICES: none now    CONSULTED AND AGREED WITH PLAN OF CARE: Patient  PLAN FOR NEXT SESSION:   Review new stretches on Friday, start with heat and try scar mobilizations and manual therapy as helpful.  Can add proprioceptive training whenever as well as coordination training for the hand.  The focus should be on mobility now not weightbearing  Melvenia Ada, OTR/L, CHT  01/30/2024, 5:09 PM

## 2024-01-30 ENCOUNTER — Ambulatory Visit (INDEPENDENT_AMBULATORY_CARE_PROVIDER_SITE_OTHER): Admitting: Rehabilitative and Restorative Service Providers"

## 2024-01-30 ENCOUNTER — Encounter: Payer: Self-pay | Admitting: Rehabilitative and Restorative Service Providers"

## 2024-01-30 DIAGNOSIS — M25632 Stiffness of left wrist, not elsewhere classified: Secondary | ICD-10-CM

## 2024-01-30 DIAGNOSIS — M25642 Stiffness of left hand, not elsewhere classified: Secondary | ICD-10-CM

## 2024-01-30 DIAGNOSIS — R278 Other lack of coordination: Secondary | ICD-10-CM

## 2024-01-30 DIAGNOSIS — R600 Localized edema: Secondary | ICD-10-CM | POA: Diagnosis not present

## 2024-01-30 DIAGNOSIS — M25532 Pain in left wrist: Secondary | ICD-10-CM

## 2024-01-30 DIAGNOSIS — M6281 Muscle weakness (generalized): Secondary | ICD-10-CM | POA: Diagnosis not present

## 2024-01-31 NOTE — Therapy (Signed)
 OUTPATIENT OCCUPATIONAL THERAPY TREATMENT NOTE   Patient Name: Jacqueline Michael MRN: [id number] DOB:Dec 21, 1970, 53 y.o., female Today's Date: 02/01/2024  PCP: Vannie EDISON MD REFERRING PROVIDER: Dr Arlinda    END OF SESSION:  OT End of Session - 02/01/24 1102     Visit Number 3    Number of Visits 12    Date for OT Re-Evaluation 03/21/24    Authorization Type [employer]    OT Start Time 1102    OT Stop Time 1152    OT Time Calculation (min) 50 min    Activity Tolerance Patient tolerated treatment well;Patient limited by pain;No increased pain;Patient limited by fatigue    Behavior During Therapy [facility] for tasks assessed/performed            History reviewed. No pertinent past medical history. History reviewed. No pertinent surgical history. Patient Active Problem List   Diagnosis Date Noted   OP (osteoporosis) 12/11/2022   Insufficiency fracture of tibia 08/30/2022   Nondisplaced fracture of lateral malleolus of left fibula, subsequent encounter for closed fracture with routine healing 08/14/2022   Osteoporosis 05/16/2021   Right knee pain 01/04/2021   Closed left cuboid fracture 11/15/2017   Hamstring tendonitis at origin 01/28/2014   HIP PAIN, RIGHT 04/27/2009   FOOT PAIN, BILATERAL 04/27/2009   BUNION, RIGHT FOOT 12/31/2007   Closed fracture of sacrum and coccyx (HCC) 12/31/2007    ONSET DATE: 01/10/24 DOS  REFERRING DIAG: F74.467 (ICD-10-CM) - Pain in left wrist   THERAPY DIAG:  Muscle weakness (generalized)  Localized edema  Pain in left wrist  Other lack of coordination  Stiffness of left wrist, not elsewhere classified  Stiffness of left hand, not elsewhere classified  Rationale for Evaluation and Treatment: Rehabilitation  PERTINENT HISTORY: left wrist distal radius fracture with intra-articular extension, comminution and notable displacement. Was also found to have small ulnar styloid fracture as well. She underwent bedside closed reduction and splinting  at that time.  she slipped and fell in a wet spa in [place] and flew home on an emergency basis, after the fracture was set and casted.  Very adventurous person with hiking, climbing, etc. She has been walking and staying active.  Her hand and fingers look very stiff today at evaluation.  Per the doctor, her wrist was very badly damaged and this injury and therapy should proceed gently.  PRECAUTIONS: None  RED FLAGS:  None   WEIGHT BEARING RESTRICTIONS: Yes no significant weightbearing in the left hand and arm for the next 4 weeks   SUBJECTIVE:   SUBJECTIVE STATEMENT: She is now 3 weeks s/p L DRF, ulnar styloid fxs.  She states having some burning and sharp pains through the EIP and also through the FPL tendon as it crosses her scar and surgical area.  This is only present with strong blocking active range of motion or flexion/extension strongly.       PAIN:  Are you having pain? Yes: NPRS scale:  1/10 at rest and up to 3-4/10 at worst in past week  Pain location: Left wrist Pain description: Aching Aggravating factors: Attempted movement Relieving factors: Rest and ice   PATIENT GOALS: To quickly return to full functional ability for her many planned trips and adventures  NEXT MD VISIT: 02/11/2024   OBJECTIVE: (All objective assessments below are from initial evaluation on: 01/24/24 unless otherwise specified.)   HAND DOMINANCE: Right   ADLs: Overall ADLs: States decreased ability to grab, hold household objects, pain and difficulty to open containers, perform  FMS tasks (manipulate fasteners on clothing), mild to moderate bathing problems as well.    FUNCTIONAL OUTCOME MEASURES: Eval: Patient Specific Functional Scale: 0.5 (typing, hair brush, holding wheel or eliptical)  (Higher Score  =  Better Ability for the Selected Tasks)      UPPER EXTREMITY ROM     Shoulder to Wrist AROM Left eval Lt 01/30/24 Lt 02/01/24  Elbow flexion 153    Elbow extension 0    Forearm  supination 59 31 50  Forearm pronation  10 29 42  Wrist flexion 12 20 20   Wrist extension 3 15 20   Wrist ulnar deviation 2 10   Wrist radial deviation 9 12   Functional dart thrower's motion (F-DTM) in ulnar flexion     F-DTM in radial extension      (Blank rows = not tested)   Hand AROM Left eval Lt 02/01/24  Full Fist Ability (or Gap to Distal Palmar Crease) 7cm gap from tip of MF to [facility] 3.7  cm gap from tip of MF to [facility]  Thumb Opposition  (Kapandji Scale)  6/10 8/10  Thumb MCP (0-60)    Thumb IP (0-80)    Thumb Radial Abduction Span     Thumb Palmar Abduction Span     Index MCP (0-90)     Index PIP (0-100)     Index DIP (0-70)     Long MCP (0-90)     Long PIP (0-100)     Long DIP (0-70)     Ring MCP (0-90)     Ring PIP (0-100)     Ring DIP (0-70)     Little MCP (0-90)     Little PIP (0-100)     Little DIP (0-70)     (Blank rows = not tested)   UPPER EXTREMITY MMT:    Eval:  NT at eval due to recent and still healing injuries, but grossly 3 -/5 MMT in the left wrist and hand today. Will be tested when appropriate.   MMT Left TBD  Shoulder flexion   Shoulder abduction   Shoulder adduction   Shoulder extension   Shoulder internal rotation   Shoulder external rotation   Middle trapezius   Lower trapezius   Elbow flexion   Elbow extension   Forearm supination   Forearm pronation   Wrist flexion   Wrist extension   Wrist ulnar deviation   Wrist radial deviation   (Blank rows = not tested)  HAND FUNCTION: Eval: Observed weakness in affected left hand.  Details TBD when safe Grip strength Right: TBD lbs, Left: TBD lbs   COORDINATION: Eval: Observed coordination impairments with affected Lt hand. 9 Hole Peg Test Left: 47 sec (25 sec is WFL)   SENSATION: Eval:  Light touch intact today, though mildly diminished/hypersensitive around sx area    EDEMA:   Eval:  Mildly swollen in left hand and wrist today   OBSERVATIONS:   01/30/2024: She apparently has  some scar adhesion for FDP to the index finger as this finger has a difficulty extending and also difficulty actively flexing and some scar motion is seen.  Thumb is also mildly stiff and adherent.  This is somewhat shocking at only 3 weeks postop.  It may be due to the severity of her fracture and the delay with fracture to surgery.     Eval: Surgical site clean and covered by Steri-Strips,  She has some nervous guarding behaviors today, hand and thumb were very stiff.  TODAY'S TREATMENT:  02/01/24: She performs active range of motion for exercise as well as new measures which shows excellent improvement now after just 2 days.  She is highly praised for this, and she seems to be managing her apparent FPL and FDP adhesions well at the scarring and surgical site.  Unfortunately OT does not want her pushing this to high pain levels as she claims, because it may interfere with the healing of her wrist and there is a slim chance that could cause FPL rupture.  She was cautioned about this, but told to continue stretches and blocking activities just at a lighter intensity.  OT educates on upgrade to wrist extension stretch which can be done in a supinated position now.  OT reviews nonweightbearing status/safety/scar mobilizations and scar care now that her Steri-Strips are off and her arm is looking well-healed.  OT then educates on light warning no weight proprioceptive activities that she should be performing to improve her joint position sense, afferent feedback, to improve wrist healing, wrist motion.  These activities include dropping and catching a tennis ball, rolling a ball on a tabletop or wall surface in circumferential patterns in linear patterns, close chain activities like magic wand letter writing as well as balance activities with the Balance Master and with a ball on a Frisbee.  She is given several ideas for this and told to add this into her repertoire to improve her motion and  proprioception.  She states understanding her HEP, all recommendations and new proprioceptive training and is encouraged to continue to do light activities like drink out of a plastic cup or for her laundry without her orthosis on.  She should also be respectful of her pain and definitely take rest breaks with the orthosis on, and she should also be wearing it when outside of the home, driving, any forceful activities.     01/30/24: She starts with active range of motion for exercise as well as new measures which does show significant improvements.  OT then upgraded her home exercise program in several ways to address some adherent scarring to the FPL and FDP to the index finger, and also to add in wrist and forearm stretches now at 3 weeks postop.  Again we review safety recommendations including no weightbearing, but OT does recommend leaving off the orthosis when safe at home to do light functional tasks.  She should always sleep in it, always wear it when out of the home or anytime she may be at risk to fall or bear weight.  OT modifies her orthosis to make it more comfortable.  She was told to discharge her shoulder sling as she states she has worn it several times still.  She is told to take stretches to a strong place but avoid sharp or significant pain while doing stretches.  She was also given blue nonslip Dycem to help increase her scar mobilizations.  At the end of the session she states understanding all directions and leaves in only mild pain   Exercises reviewed and performed today (bolded are new) - Forearm Supination Stretch  - 3-4 x daily - 3-5 reps - 15 sec hold - Forearm Pronation Stretch  - 3-4 x daily - 3-5 reps - 15 sec hold - Wrist Extension Stretch Pronated  - 4 x daily - 3-5 reps - 15 hold - Wrist Flexion Stretch  - 4 x daily - 3-5 reps - 15 sec hold - Palm Up / Palm Down  - 3-4 x  daily - 1-2 sets - 10-15 reps - Bend and Pull Back Wrist SLOWLY  - 3-4 x daily - 1-2 sets - 10-15  reps - Windshield Wipers   - 3-4 x daily - 1-2 sets - 10-15 reps - Wrist AROM Dart Throwers Motion  - 3-4 x daily - 1-2 sets - 10-15 reps - BACK KNUCKLE STRETCHES   - 4 x daily - 3-5 reps - 15 sec hold - HOOK Stretch  - 4 x daily - 3-5 reps - 15-20 sec hold - Seated Finger Composite Flexion Stretch  - 4 x daily - 3-5 reps - 15 hold - Thumb stretch  - 4 x daily - 3-5 reps - 15-20 sec hold - PUSH KNUCKLES DOWN  - 4 x daily - 3-5 reps - 15 seconds hold - Thumb AROM IP Blocking  - 4-6 x daily - 10-15 reps - Tip Joint Blocking Motion  - 4-6 x daily - 10-15 reps - Tendon Glides  - 3-4 x daily - 3-5 reps - 2-3 seconds hold - Touch Thumb to [facility] Finger  - 3-4 x daily - 1-2 sets - 10 reps Patient Education - Scar Massage    PATIENT EDUCATION: Education details: See tx section above for details  Person educated: Patient Education method: Verbal Instruction, Teach back, Handouts  Education comprehension: States and demonstrates understanding, Additional Education required    HOME EXERCISE PROGRAM: Access Code: [email or web address] URL: [email or web address] Date: 01/24/2024 Prepared by: Melvenia Ada   GOALS: Goals reviewed with patient? Yes   SHORT TERM GOALS: (STG required if POC>30 days) Target Date: 02/08/24  Pt will obtain protective, custom orthotic. Goal status: 01/24/24: MET  2.  Pt will demo/state understanding of initial HEP to improve pain levels and prerequisite motion. Goal status: INITIAL   LONG TERM GOALS: Target Date: 03/21/24  Pt will improve functional ability by decreased impairment per PSFS assessment from 0.5 to 6.5 or better, for better quality of life. Goal status: INITIAL  2.  Pt will improve grip strength in Lt hand from unsafe to test to at least 40lbs for functional use at home and in IADLs. Goal status: INITIAL  3.  Pt will improve A/ROM in Lt wrist flex/ext from 12/3 degrees respectively, to at least 55* each, to have functional motion for  tasks like reach and grasp.  Goal status: INITIAL  4.  Pt will improve strength in Lt hand/arm from apparent 3-/5 MMT to at least 4+/5 MMT to have increased functional ability to carry out selfcare and higher-level homecare tasks with less difficulty. Goal status: INITIAL  5.  Pt will improve coordination skills in Lt hand/arm, as seen by within functional limit score on 9HPT testing to have increased functional ability to carry out fine motor tasks (fasteners, etc.) and more complex, coordinated IADLs (meal prep, sports, etc.).  Goal status: INITIAL  6.  Pt will decrease pain at worst from 6-7/10 to 3/10 or better to have better sleep and occupational participation in daily roles. Goal status: INITIAL   ASSESSMENT:  CLINICAL IMPRESSION: 02/01/24: She continues to have signs of adherence to the FPL and to the FDP of the index finger into her scar and surgical area.  OT has very slight concern for proximal plate rubbing on FPL due to her increase in pain.  She was cautioned about this.  We will continue on as tolerated   01/30/24: She seems to have some unfortunate adherence of FPL and FDP to the index finger, but we are working  on it.  She still very stiff but her motion is steadily improving.  Continue on    PLAN:  OT FREQUENCY: 1-2x/week  OT DURATION: 8 weeks through 03/21/24 and up to 12 total visits as needed   PLANNED INTERVENTIONS: 97535 self care/ADL training, 02889 therapeutic exercise, 97530 therapeutic activity, 97112 neuromuscular re-education, 97140 manual therapy, 97035 ultrasound, 97032 electrical stimulation (manual), 97760 Orthotic Initial, 97763 Orthotic/Prosthetic subsequent, compression bandaging, Dry needling, energy conservation, coping strategies training, and patient/family education  RECOMMENDED OTHER SERVICES: none now    CONSULTED AND AGREED WITH PLAN OF CARE: Patient  PLAN FOR NEXT SESSION:   Upgrade to approximately 3-1/2 weeks to 4 weeks postop next week  consider reducing schedule to 1 time a week as she is mastering her HEP very well now.   Melvenia Ada, OTR/L, CHT  02/01/2024, 12:27 PM

## 2024-02-01 ENCOUNTER — Ambulatory Visit (INDEPENDENT_AMBULATORY_CARE_PROVIDER_SITE_OTHER): Admitting: Rehabilitative and Restorative Service Providers"

## 2024-02-01 ENCOUNTER — Encounter: Payer: Self-pay | Admitting: Rehabilitative and Restorative Service Providers"

## 2024-02-01 DIAGNOSIS — M25532 Pain in left wrist: Secondary | ICD-10-CM

## 2024-02-01 DIAGNOSIS — M6281 Muscle weakness (generalized): Secondary | ICD-10-CM | POA: Diagnosis not present

## 2024-02-01 DIAGNOSIS — R278 Other lack of coordination: Secondary | ICD-10-CM | POA: Diagnosis not present

## 2024-02-01 DIAGNOSIS — R600 Localized edema: Secondary | ICD-10-CM | POA: Diagnosis not present

## 2024-02-01 DIAGNOSIS — M25642 Stiffness of left hand, not elsewhere classified: Secondary | ICD-10-CM

## 2024-02-01 DIAGNOSIS — M25632 Stiffness of left wrist, not elsewhere classified: Secondary | ICD-10-CM

## 2024-02-01 NOTE — Therapy (Signed)
 OUTPATIENT OCCUPATIONAL THERAPY TREATMENT NOTE   Patient Name: Jacqueline Michael MRN: [id number] DOB:Dec 30, 1970, 53 y.o., female Today's Date: 02/04/2024  PCP: Vannie EDISON MD REFERRING PROVIDER: Dr Arlinda    END OF SESSION:  OT End of Session - 02/04/24 1100     Visit Number 4    Number of Visits 12    Date for OT Re-Evaluation 03/21/24    Authorization Type [employer]    OT Start Time 1100    OT Stop Time 1149    OT Time Calculation (min) 49 min    Equipment Utilized During Treatment Strapping material    Activity Tolerance Patient tolerated treatment well;Patient limited by pain;No increased pain;Patient limited by fatigue    Behavior During Therapy [facility] for tasks assessed/performed           History reviewed. No pertinent past medical history. History reviewed. No pertinent surgical history. Patient Active Problem List   Diagnosis Date Noted   OP (osteoporosis) 12/11/2022   Insufficiency fracture of tibia 08/30/2022   Nondisplaced fracture of lateral malleolus of left fibula, subsequent encounter for closed fracture with routine healing 08/14/2022   Osteoporosis 05/16/2021   Right knee pain 01/04/2021   Closed left cuboid fracture 11/15/2017   Hamstring tendonitis at origin 01/28/2014   HIP PAIN, RIGHT 04/27/2009   FOOT PAIN, BILATERAL 04/27/2009   BUNION, RIGHT FOOT 12/31/2007   Closed fracture of sacrum and coccyx (HCC) 12/31/2007    ONSET DATE: 01/10/24 DOS  REFERRING DIAG: F74.467 (ICD-10-CM) - Pain in left wrist   THERAPY DIAG:  Muscle weakness (generalized)  Localized edema  Pain in left wrist  Other lack of coordination  Stiffness of left wrist, not elsewhere classified  Stiffness of left hand, not elsewhere classified  Rationale for Evaluation and Treatment: Rehabilitation  PERTINENT HISTORY: left wrist distal radius fracture with intra-articular extension, comminution and notable displacement. Was also found to have small ulnar styloid fracture as  well. She underwent bedside closed reduction and splinting at that time.  she slipped and fell in a wet spa in [place] and flew home on an emergency basis, after the fracture was set and casted.  Very adventurous person with hiking, climbing, etc. She has been walking and staying active.  Her hand and fingers look very stiff today at evaluation.  Per the doctor, her wrist was very badly damaged and this injury and therapy should proceed gently.  PRECAUTIONS: None  RED FLAGS:  None   WEIGHT BEARING RESTRICTIONS: Yes no significant weightbearing in the left hand and arm for the next 4 weeks   SUBJECTIVE:   SUBJECTIVE STATEMENT: She is now 3.5 weeks s/p L DRF, ulnar styloid fxs.  She states pain in the thumb and index finger with motion through the scar are mildly less irritating now.  She continues to push herself for more and more activity.  She is reminded that she is a still only 3.5 weeks postop    PAIN:  Are you having pain? Yes: NPRS scale: 0-1/10 at rest and up to 3-4/10 at worst in past week  Pain location: Left wrist Pain description: Aching Aggravating factors: Attempted movement Relieving factors: Rest and ice   PATIENT GOALS: To quickly return to full functional ability for her many planned trips and adventures  NEXT MD VISIT: 02/11/2024   OBJECTIVE: (All objective assessments below are from initial evaluation on: 01/24/24 unless otherwise specified.)   HAND DOMINANCE: Right   ADLs: Overall ADLs: States decreased ability to grab, hold household objects,  pain and difficulty to open containers, perform FMS tasks (manipulate fasteners on clothing), mild to moderate bathing problems as well.    FUNCTIONAL OUTCOME MEASURES: Eval: Patient Specific Functional Scale: 0.5 (typing, hair brush, holding wheel or eliptical)  (Higher Score  =  Better Ability for the Selected Tasks)      UPPER EXTREMITY ROM     Shoulder to Wrist AROM Left eval Lt 01/30/24 Lt 02/01/24 Lt 02/03/34   Elbow flexion 153     Elbow extension 0     Forearm supination 59 31 50 61  Forearm pronation  10 29 42 39  Wrist flexion 12 20 20 30   Wrist extension 3 15 20 23   Wrist ulnar deviation 2 10    Wrist radial deviation 9 12    Functional dart thrower's motion (F-DTM) in ulnar flexion      F-DTM in radial extension       (Blank rows = not tested)   Hand AROM Left eval Lt 02/01/24 Lt  02/04/24  Full Fist Ability (or Gap to Distal Palmar Crease) 7cm gap from tip of MF to [facility] 3.7  cm gap from tip of MF to [facility] Digits 4,5 touch palm, digit 3 is close, digit 2 is most limited   Thumb Opposition  (Kapandji Scale)  6/10 8/10   Thumb MCP (0-60)   0-50  Thumb IP (0-80)   0-10  Thumb Radial Abduction Span      Thumb Palmar Abduction Span      Index MCP (0-90)    0- 77  Index PIP (0-100)    (-8)- 49  Index DIP (0-70)    0- 14  Long MCP (0-90)      Long PIP (0-100)      Long DIP (0-70)      Ring MCP (0-90)      Ring PIP (0-100)      Ring DIP (0-70)      Little MCP (0-90)      Little PIP (0-100)      Little DIP (0-70)      (Blank rows = not tested)   UPPER EXTREMITY MMT:    Eval:  NT at eval due to recent and still healing injuries, but grossly 3 -/5 MMT in the left wrist and hand today. Will be tested when appropriate.   MMT Left TBD  Shoulder flexion   Shoulder abduction   Shoulder adduction   Shoulder extension   Shoulder internal rotation   Shoulder external rotation   Middle trapezius   Lower trapezius   Elbow flexion   Elbow extension   Forearm supination   Forearm pronation   Wrist flexion   Wrist extension   Wrist ulnar deviation   Wrist radial deviation   (Blank rows = not tested)  HAND FUNCTION: Eval: Observed weakness in affected left hand.  Details TBD when safe Grip strength Right: TBD lbs, Left: TBD lbs   COORDINATION: Eval: Observed coordination impairments with affected Lt hand. 9 Hole Peg Test Left: 47 sec (25 sec is WFL)   SENSATION: Eval:  Light  touch intact today, though mildly diminished/hypersensitive around sx area    EDEMA:   Eval:  Mildly swollen in left hand and wrist today   OBSERVATIONS:   01/30/2024: She apparently has some scar adhesion for FDP to the index finger as this finger has a difficulty extending and also difficulty actively flexing and some scar motion is seen.  Thumb is also mildly stiff and  adherent.  This is somewhat shocking at only 3 weeks postop.  It may be due to the severity of her fracture and the delay with fracture to surgery.     Eval: Surgical site clean and covered by Steri-Strips,  She has some nervous guarding behaviors today, hand and thumb were very stiff.    TODAY'S TREATMENT:  02/04/24: She starts with active range of motion for exercise as well as new measures which shows some overall neck improvements in motion of the wrist and forearm and hand.  The index finger and thumb still mildly entrapped in the scar and mildly painful, so OT performs manual therapy cupping and scar mobilizations over this area.  She has some bruising with this, but no lingering pain.  OT does very careful grade 2-3 joint mobilizations at the wrist for flexion and extension which she tolerates well.  Additionally, OT does manual therapy kinesiotaping today on the dorsum of the wrist to promote wrist extension as well as help relieve edema and put some pieces volarly to help mobilize the scar.  OT reviews her stretches with her, performing with her and have her perform back to ensure nonpainful performance.  We discussed safety/self-care as always, she is reminded to do no significant weightbearing but as she is weaning out of her heart orthosis for light tasks, she should try to wear compressive wrist strap for some support.  1 was given to her today and she states it does feel mildly supportive.  She leaves without questions or pain at the end of the session    02/01/24: She performs active range of motion for exercise as  well as new measures which shows excellent improvement now after just 2 days.  She is highly praised for this, and she seems to be managing her apparent FPL and FDP adhesions well at the scarring and surgical site.  Unfortunately OT does not want her pushing this to high pain levels as she claims, because it may interfere with the healing of her wrist and there is a slim chance that could cause FPL rupture.  She was cautioned about this, but told to continue stretches and blocking activities just at a lighter intensity.  OT educates on upgrade to wrist extension stretch which can be done in a supinated position now.  OT reviews nonweightbearing status/safety/scar mobilizations and scar care now that her Steri-Strips are off and her arm is looking well-healed.  OT then educates on light warning no weight proprioceptive activities that she should be performing to improve her joint position sense, afferent feedback, to improve wrist healing, wrist motion.  These activities include dropping and catching a tennis ball, rolling a ball on a tabletop or wall surface in circumferential patterns in linear patterns, close chain activities like magic wand letter writing as well as balance activities with the Balance Master and with a ball on a Frisbee.  She is given several ideas for this and told to add this into her repertoire to improve her motion and proprioception.  She states understanding her HEP, all recommendations and new proprioceptive training and is encouraged to continue to do light activities like drink out of a plastic cup or for her laundry without her orthosis on.  She should also be respectful of her pain and definitely take rest breaks with the orthosis on, and she should also be wearing it when outside of the home, driving, any forceful activities.     01/30/24: She starts with active range of motion for  exercise as well as new measures which does show significant improvements.  OT then upgraded  her home exercise program in several ways to address some adherent scarring to the FPL and FDP to the index finger, and also to add in wrist and forearm stretches now at 3 weeks postop.  Again we review safety recommendations including no weightbearing, but OT does recommend leaving off the orthosis when safe at home to do light functional tasks.  She should always sleep in it, always wear it when out of the home or anytime she may be at risk to fall or bear weight.  OT modifies her orthosis to make it more comfortable.  She was told to discharge her shoulder sling as she states she has worn it several times still.  She is told to take stretches to a strong place but avoid sharp or significant pain while doing stretches.  She was also given blue nonslip Dycem to help increase her scar mobilizations.  At the end of the session she states understanding all directions and leaves in only mild pain   Exercises reviewed and performed today (bolded are new) - Forearm Supination Stretch  - 3-4 x daily - 3-5 reps - 15 sec hold - Forearm Pronation Stretch  - 3-4 x daily - 3-5 reps - 15 sec hold - Wrist Extension Stretch Pronated  - 4 x daily - 3-5 reps - 15 hold - Wrist Flexion Stretch  - 4 x daily - 3-5 reps - 15 sec hold - Palm Up / Palm Down  - 3-4 x daily - 1-2 sets - 10-15 reps - Bend and Pull Back Wrist SLOWLY  - 3-4 x daily - 1-2 sets - 10-15 reps - Windshield Wipers   - 3-4 x daily - 1-2 sets - 10-15 reps - Wrist AROM Dart Throwers Motion  - 3-4 x daily - 1-2 sets - 10-15 reps - BACK KNUCKLE STRETCHES   - 4 x daily - 3-5 reps - 15 sec hold - HOOK Stretch  - 4 x daily - 3-5 reps - 15-20 sec hold - Seated Finger Composite Flexion Stretch  - 4 x daily - 3-5 reps - 15 hold - Thumb stretch  - 4 x daily - 3-5 reps - 15-20 sec hold - PUSH KNUCKLES DOWN  - 4 x daily - 3-5 reps - 15 seconds hold - Thumb AROM IP Blocking  - 4-6 x daily - 10-15 reps - Tip Joint Blocking Motion  - 4-6 x daily - 10-15 reps -  Tendon Glides  - 3-4 x daily - 3-5 reps - 2-3 seconds hold - Touch Thumb to [facility] Finger  - 3-4 x daily - 1-2 sets - 10 reps Patient Education - Scar Massage    PATIENT EDUCATION: Education details: See tx section above for details  Person educated: Patient Education method: Verbal Instruction, Teach back, Handouts  Education comprehension: States and demonstrates understanding, Additional Education required    HOME EXERCISE PROGRAM: Access Code: [email or web address] URL: [email or web address] Date: 01/24/2024 Prepared by: Melvenia Ada   GOALS: Goals reviewed with patient? Yes   SHORT TERM GOALS: (STG required if POC>30 days) Target Date: 02/08/24  Pt will obtain protective, custom orthotic. Goal status: 01/24/24: MET  2.  Pt will demo/state understanding of initial HEP to improve pain levels and prerequisite motion. Goal status: 02/04/2024: Met   LONG TERM GOALS: Target Date: 03/21/24  Pt will improve functional ability by decreased impairment per PSFS assessment from 0.5 to 6.5 or better,  for better quality of life. Goal status: INITIAL  2.  Pt will improve grip strength in Lt hand from unsafe to test to at least 40lbs for functional use at home and in IADLs. Goal status: INITIAL  3.  Pt will improve A/ROM in Lt wrist flex/ext from 12/3 degrees respectively, to at least 55* each, to have functional motion for tasks like reach and grasp.  Goal status: INITIAL  4.  Pt will improve strength in Lt hand/arm from apparent 3-/5 MMT to at least 4+/5 MMT to have increased functional ability to carry out selfcare and higher-level homecare tasks with less difficulty. Goal status: INITIAL  5.  Pt will improve coordination skills in Lt hand/arm, as seen by within functional limit score on 9HPT testing to have increased functional ability to carry out fine motor tasks (fasteners, etc.) and more complex, coordinated IADLs (meal prep, sports, etc.).  Goal status: INITIAL  6.  Pt  will decrease pain at worst from 6-7/10 to 3/10 or better to have better sleep and occupational participation in daily roles. Goal status: INITIAL   ASSESSMENT:  CLINICAL IMPRESSION: 02/04/24: Her motion continues to improve the wrist extension is especially stubborn likely due to adherent scar.  FPL and FDP to index finger are also mildly adhered still as seen with tenodesis effect.  OT will consider making wrist extension bracing with composite finger extension mobilization orthosis in the next 1-2 sessions to help address this and any capsular concerns.   PLAN:  OT FREQUENCY: 1-2x/week  OT DURATION: 8 weeks through 03/21/24 and up to 12 total visits as needed   PLANNED INTERVENTIONS: 97535 self care/ADL training, [id number] therapeutic exercise, 97530 therapeutic activity, 97112 neuromuscular re-education, 97140 manual therapy, 97035 ultrasound, 97032 electrical stimulation (manual), 97760 Orthotic Initial, 97763 Orthotic/Prosthetic subsequent, compression bandaging, Dry needling, energy conservation, coping strategies training, and patient/family education  RECOMMENDED OTHER SERVICES: none now    CONSULTED AND AGREED WITH PLAN OF CARE: Patient  PLAN FOR NEXT SESSION:   Upgrade to 4 weeks postop, consider dynamic orthosis for mobilization in next 1-2 visits if wrist extension not significantly improving, ask about K tape, ask about effects of cupping  Jonathen Rathman, OTR/L, CHT  02/04/2024, 1:00 PM

## 2024-02-04 ENCOUNTER — Encounter: Payer: Self-pay | Admitting: Rehabilitative and Restorative Service Providers"

## 2024-02-04 ENCOUNTER — Ambulatory Visit (INDEPENDENT_AMBULATORY_CARE_PROVIDER_SITE_OTHER): Admitting: Rehabilitative and Restorative Service Providers"

## 2024-02-04 DIAGNOSIS — R278 Other lack of coordination: Secondary | ICD-10-CM

## 2024-02-04 DIAGNOSIS — R600 Localized edema: Secondary | ICD-10-CM | POA: Diagnosis not present

## 2024-02-04 DIAGNOSIS — M6281 Muscle weakness (generalized): Secondary | ICD-10-CM

## 2024-02-04 DIAGNOSIS — M25532 Pain in left wrist: Secondary | ICD-10-CM | POA: Diagnosis not present

## 2024-02-04 DIAGNOSIS — M25642 Stiffness of left hand, not elsewhere classified: Secondary | ICD-10-CM

## 2024-02-04 DIAGNOSIS — M25632 Stiffness of left wrist, not elsewhere classified: Secondary | ICD-10-CM

## 2024-02-05 NOTE — Therapy (Signed)
 OUTPATIENT OCCUPATIONAL THERAPY TREATMENT NOTE   Patient Name: Jacqueline Michael MRN: [id number] DOB:February 27, 1971, 53 y.o., female Today's Date: 02/06/2024  PCP: Vannie EDISON MD REFERRING PROVIDER: Dr Arlinda    END OF SESSION:  OT End of Session - 02/06/24 1557     Visit Number 5    Number of Visits 12    Date for OT Re-Evaluation 03/21/24    Authorization Type BCBS    OT Start Time 1557    OT Stop Time 1637    OT Time Calculation (min) 40 min    Equipment Utilized During Treatment yellow putty    Activity Tolerance Patient tolerated treatment well;Patient limited by pain;No increased pain;Patient limited by fatigue    Behavior During Therapy [facility] for tasks assessed/performed            History reviewed. No pertinent past medical history. History reviewed. No pertinent surgical history. Patient Active Problem List   Diagnosis Date Noted   OP (osteoporosis) 12/11/2022   Insufficiency fracture of tibia 08/30/2022   Nondisplaced fracture of lateral malleolus of left fibula, subsequent encounter for closed fracture with routine healing 08/14/2022   Osteoporosis 05/16/2021   Right knee pain 01/04/2021   Closed left cuboid fracture 11/15/2017   Hamstring tendonitis at origin 01/28/2014   HIP PAIN, RIGHT 04/27/2009   FOOT PAIN, BILATERAL 04/27/2009   BUNION, RIGHT FOOT 12/31/2007   Closed fracture of sacrum and coccyx (HCC) 12/31/2007    ONSET DATE: 01/10/24 DOS  REFERRING DIAG: F74.467 (ICD-10-CM) - Pain in left wrist   THERAPY DIAG:  Muscle weakness (generalized)  Localized edema  Pain in left wrist  Other lack of coordination  Stiffness of left wrist, not elsewhere classified  Stiffness of left hand, not elsewhere classified  Rationale for Evaluation and Treatment: Rehabilitation  PERTINENT HISTORY: left wrist distal radius fracture with intra-articular extension, comminution and notable displacement. Was also found to have small ulnar styloid fracture as well.  She underwent bedside closed reduction and splinting at that time.  she slipped and fell in a wet spa in [place] and flew home on an emergency basis, after the fracture was set and casted.  Very adventurous person with hiking, climbing, etc. She has been walking and staying active.  Her hand and fingers look very stiff today at evaluation.  Per the doctor, her wrist was very badly damaged and this injury and therapy should proceed gently.  PRECAUTIONS: None  RED FLAGS:  None   WEIGHT BEARING RESTRICTIONS: Yes no significant weightbearing in the left hand and arm for the next 4 weeks   SUBJECTIVE:   SUBJECTIVE STATEMENT: She is now 4 weeks s/p L DRF, ulnar styloid fxs.  She states K tape seems to have helped her perform wrist extension, and she is having a bit less pain now with isolated motion of the index finger and thumb, has been doing some cupping on her own as well as red light therapy.       PAIN:  Are you having pain? Yes: NPRS scale:  0-1/10 at rest and up to 3-4/10 at worst in past week  Pain location: Left wrist Pain description: Aching Aggravating factors: Attempted movement Relieving factors: Rest and ice   PATIENT GOALS: To quickly return to full functional ability for her many planned trips and adventures  NEXT MD VISIT: 02/11/2024   OBJECTIVE: (All objective assessments below are from initial evaluation on: 01/24/24 unless otherwise specified.)   HAND DOMINANCE: Right   ADLs: Overall ADLs: States decreased  ability to grab, hold household objects, pain and difficulty to open containers, perform FMS tasks (manipulate fasteners on clothing), mild to moderate bathing problems as well.    FUNCTIONAL OUTCOME MEASURES: Eval: Patient Specific Functional Scale: 0.5 (typing, hair brush, holding wheel or eliptical)  (Higher Score  =  Better Ability for the Selected Tasks)      UPPER EXTREMITY ROM     Shoulder to Wrist AROM Left eval Lt 01/30/24 Lt 02/01/24 Lt 02/03/34  Lt 02/06/24  Elbow flexion 153      Elbow extension 0      Forearm supination 59 31 50 61 58  Forearm pronation  10 29 42 39 54  Wrist flexion 12 20 20 30 20  (34 PROM)  Wrist extension 3 15 20 23 30   Wrist ulnar deviation 2 10     Wrist radial deviation 9 12     Functional dart thrower's motion (F-DTM) in ulnar flexion       F-DTM in radial extension        (Blank rows = not tested)   Hand AROM Left eval Lt 02/01/24 Lt  02/04/24 Lt 02/06/24  Full Fist Ability (or Gap to Distal Palmar Crease) 7cm gap from tip of MF to [facility] 3.7  cm gap from tip of MF to [facility] Digits 4,5 touch palm, digit 3 is close, digit 2 is most limited    Thumb Opposition  (Kapandji Scale)  6/10 8/10  9/10  Thumb MCP (0-60)   0-50   Thumb IP (0-80)   0-10   Thumb Radial Abduction Span       Thumb Palmar Abduction Span       Index MCP (0-90)    0- 77 71  Index PIP (0-100)    (-8)- 49 67  Index DIP (0-70)    0- 14 31  Long MCP (0-90)       Long PIP (0-100)       Long DIP (0-70)       Ring MCP (0-90)       Ring PIP (0-100)       Ring DIP (0-70)       Little MCP (0-90)       Little PIP (0-100)       Little DIP (0-70)       (Blank rows = not tested)   UPPER EXTREMITY MMT:    Eval:  NT at eval due to recent and still healing injuries, but grossly 3 -/5 MMT in the left wrist and hand today. Will be tested when appropriate.   MMT Left TBD  Shoulder flexion   Shoulder abduction   Shoulder adduction   Shoulder extension   Shoulder internal rotation   Shoulder external rotation   Middle trapezius   Lower trapezius   Elbow flexion   Elbow extension   Forearm supination   Forearm pronation   Wrist flexion   Wrist extension   Wrist ulnar deviation   Wrist radial deviation   (Blank rows = not tested)  HAND FUNCTION: Eval: Observed weakness in affected left hand.  Details TBD when safe Grip strength Right: 54.5 lbs, Left: 15.5 lbs   COORDINATION: 02/06/24: 9HPT Lt 16 sec completely functional and better  than average.    Eval: Observed coordination impairments with affected Lt hand. 9 Hole Peg Test Left: 47 sec (25 sec is WFL)   SENSATION: Eval:  Light touch intact today, though mildly diminished/hypersensitive around sx area    EDEMA:  Eval:  Mildly swollen in left hand and wrist today   OBSERVATIONS:   01/30/2024: She apparently has some scar adhesion for FDP to the index finger as this finger has a difficulty extending and also difficulty actively flexing and some scar motion is seen.  Thumb is also mildly stiff and adherent.  This is somewhat shocking at only 3 weeks postop.  It may be due to the severity of her fracture and the delay with fracture to surgery.     Eval: Surgical site clean and covered by Steri-Strips,  She has some nervous guarding behaviors today, hand and thumb were very stiff.    TODAY'S TREATMENT:  02/06/24: She terms with active range of motion for exercise as well as new measures which shows continued improvement in all planes of motion except wrist flexion which is mildly limited likely due to kinesiotaping to support extension.  Kinesiotape is not bothering her and seems to help extension measures today.  We review her stretches and scar mobilization techniques, and OT shows her several ways to do wrist extension which is slightly deferred now.  She tolerates these well.  Additionally, as she tolerates light gripping today and seems to be adherent through the FDP and FPL, OT starts her with some light gripping and pinching activities with yellow therapy putty.  She was told to not do this if it is very painful, but to expect some pain through the surgical site thumb and index finger.  She does these today with some mild pain that is relieved after some rest.  Otherwise, she was told to carefully wean from her orthosis for light activities, but wear it supportively when she is resting or feeling increased in pain.  She states understanding all directions and leaves in  no significant pain   Exercises performed are reviewed today: - Forearm Supination Stretch  - 3-4 x daily - 3-5 reps - 15 sec hold - Forearm Pronation Stretch  - 3-4 x daily - 3-5 reps - 15 sec hold - Wrist Extension Stretch Pronated  - 4 x daily - 3-5 reps - 15 hold - Wrist Flexion Stretch  - 4 x daily - 3-5 reps - 15 sec hold - Palm Up / Palm Down  - 3-4 x daily - 1-2 sets - 10-15 reps - Bend and Pull Back Wrist SLOWLY  - 3-4 x daily - 1-2 sets - 10-15 reps - Windshield Wipers   - 3-4 x daily - 1-2 sets - 10-15 reps - Wrist AROM Dart Throwers Motion  - 3-4 x daily - 1-2 sets - 10-15 reps - BACK KNUCKLE STRETCHES   - 4 x daily - 3-5 reps - 15 sec hold - HOOK Stretch  - 4 x daily - 3-5 reps - 15-20 sec hold - Seated Finger Composite Flexion Stretch  - 4 x daily - 3-5 reps - 15 hold - Thumb stretch  - 4 x daily - 3-5 reps - 15-20 sec hold - PUSH KNUCKLES DOWN  - 4 x daily - 3-5 reps - 15 seconds hold - Thumb AROM IP Blocking  - 4-6 x daily - 10-15 reps - Tip Joint Blocking Motion  - 4-6 x daily - 10-15 reps - Tendon Glides  - 3-4 x daily - 3-5 reps - 2-3 seconds hold - Touch Thumb to [facility] Finger  - 3-4 x daily - 1-2 sets - 10 reps - Full Fist  - 1-2 x daily - 4-6 x weekly - 5 reps - 10  sec hold - Thumb Opposition with Putty  - 1-2 x daily - 5 reps - 10 sec hold - Thumb Press  - 1-2 x daily - 5 reps - 10 sec hold Patient Education - Scar Massage   PATIENT EDUCATION: Education details: See tx section above for details  Person educated: Patient Education method: Verbal Instruction, Teach back, Handouts  Education comprehension: States and demonstrates understanding, Additional Education required    HOME EXERCISE PROGRAM: Access Code: [email or web address] URL: [email or web address] Date: 01/24/2024 Prepared by: Melvenia Ada   GOALS: Goals reviewed with patient? Yes   SHORT TERM GOALS: (STG required if POC>30 days) Target Date: 02/08/24  Pt will obtain protective,  custom orthotic. Goal status: 01/24/24: MET  2.  Pt will demo/state understanding of initial HEP to improve pain levels and prerequisite motion. Goal status: 02/04/2024: Met   LONG TERM GOALS: Target Date: 03/21/24  Pt will improve functional ability by decreased impairment per PSFS assessment from 0.5 to 6.5 or better, for better quality of life. Goal status: INITIAL  2.  Pt will improve grip strength in Lt hand from unsafe to test to at least 40lbs for functional use at home and in IADLs. Goal status: INITIAL  3.  Pt will improve A/ROM in Lt wrist flex/ext from 12/3 degrees respectively, to at least 55* each, to have functional motion for tasks like reach and grasp.  Goal status: INITIAL  4.  Pt will improve strength in Lt hand/arm from apparent 3-/5 MMT to at least 4+/5 MMT to have increased functional ability to carry out selfcare and higher-level homecare tasks with less difficulty. Goal status: INITIAL  5.  Pt will improve coordination skills in Lt hand/arm, as seen by within functional limit score on 9HPT testing to have increased functional ability to carry out fine motor tasks (fasteners, etc.) and more complex, coordinated IADLs (meal prep, sports, etc.).  Goal status: INITIAL  6.  Pt will decrease pain at worst from 6-7/10 to 3/10 or better to have better sleep and occupational participation in daily roles. Goal status: INITIAL   ASSESSMENT:  CLINICAL IMPRESSION: 02/06/24: She continues to make progress despite some scar adherence limiting thumb and finger motion.  If she is not making good net gains and wrist motion after Monday, OT will fabricate a mobilization orthosis for her.  This is as long as her healing looks appropriate on x-rays that she will have on Monday.  02/04/24: Her motion continues to improve the wrist extension is especially stubborn likely due to adherent scar.  FPL and FDP to index finger are also mildly adhered still as seen with tenodesis effect.  OT  will consider making wrist extension bracing with composite finger extension mobilization orthosis in the next 1-2 sessions to help address this and any capsular concerns.   PLAN:  OT FREQUENCY: 1-2x/week  OT DURATION: 8 weeks through 03/21/24 and up to 12 total visits as needed   PLANNED INTERVENTIONS: 97535 self care/ADL training, 02889 therapeutic exercise, 97530 therapeutic activity, 97112 neuromuscular re-education, 97140 manual therapy, 97035 ultrasound, 97032 electrical stimulation (manual), 97760 Orthotic Initial, 97763 Orthotic/Prosthetic subsequent, compression bandaging, Dry needling, energy conservation, coping strategies training, and patient/family education  RECOMMENDED OTHER SERVICES: none now    CONSULTED AND AGREED WITH PLAN OF CARE: Patient  PLAN FOR NEXT SESSION:   Check MD notes, consider dynamic bracing to mobilize the wrist, check therapy putty activities   Salle Brandle Ada, OTR/L, CHT  02/06/2024, 4:57 PM

## 2024-02-06 ENCOUNTER — Ambulatory Visit (INDEPENDENT_AMBULATORY_CARE_PROVIDER_SITE_OTHER): Admitting: Rehabilitative and Restorative Service Providers"

## 2024-02-06 ENCOUNTER — Encounter: Payer: Self-pay | Admitting: Rehabilitative and Restorative Service Providers"

## 2024-02-06 DIAGNOSIS — R600 Localized edema: Secondary | ICD-10-CM | POA: Diagnosis not present

## 2024-02-06 DIAGNOSIS — M6281 Muscle weakness (generalized): Secondary | ICD-10-CM

## 2024-02-06 DIAGNOSIS — M25632 Stiffness of left wrist, not elsewhere classified: Secondary | ICD-10-CM

## 2024-02-06 DIAGNOSIS — M25642 Stiffness of left hand, not elsewhere classified: Secondary | ICD-10-CM

## 2024-02-06 DIAGNOSIS — R278 Other lack of coordination: Secondary | ICD-10-CM

## 2024-02-06 DIAGNOSIS — M25532 Pain in left wrist: Secondary | ICD-10-CM

## 2024-02-11 ENCOUNTER — Ambulatory Visit (INDEPENDENT_AMBULATORY_CARE_PROVIDER_SITE_OTHER): Payer: Self-pay

## 2024-02-11 ENCOUNTER — Ambulatory Visit (INDEPENDENT_AMBULATORY_CARE_PROVIDER_SITE_OTHER): Admitting: Rehabilitative and Restorative Service Providers"

## 2024-02-11 ENCOUNTER — Ambulatory Visit (INDEPENDENT_AMBULATORY_CARE_PROVIDER_SITE_OTHER): Admitting: Orthopedic Surgery

## 2024-02-11 ENCOUNTER — Encounter: Payer: Self-pay | Admitting: Rehabilitative and Restorative Service Providers"

## 2024-02-11 DIAGNOSIS — M25532 Pain in left wrist: Secondary | ICD-10-CM | POA: Diagnosis not present

## 2024-02-11 DIAGNOSIS — Z9889 Other specified postprocedural states: Secondary | ICD-10-CM

## 2024-02-11 DIAGNOSIS — Z8781 Personal history of (healed) traumatic fracture: Secondary | ICD-10-CM

## 2024-02-11 DIAGNOSIS — M25642 Stiffness of left hand, not elsewhere classified: Secondary | ICD-10-CM

## 2024-02-11 DIAGNOSIS — R278 Other lack of coordination: Secondary | ICD-10-CM

## 2024-02-11 DIAGNOSIS — M6281 Muscle weakness (generalized): Secondary | ICD-10-CM | POA: Diagnosis not present

## 2024-02-11 DIAGNOSIS — R600 Localized edema: Secondary | ICD-10-CM

## 2024-02-11 DIAGNOSIS — M25632 Stiffness of left wrist, not elsewhere classified: Secondary | ICD-10-CM

## 2024-02-11 NOTE — Therapy (Signed)
 OUTPATIENT OCCUPATIONAL THERAPY TREATMENT NOTE   Patient Name: Jacqueline Michael MRN: [id number] DOB:Mar 26, 1971, 53 y.o., female Today's Date: 02/11/2024  PCP: Vannie EDISON MD REFERRING PROVIDER: Dr Arlinda    END OF SESSION:  OT End of Session - 02/11/24 0853     Visit Number 6    Number of Visits 12    Date for OT Re-Evaluation 03/21/24    Authorization Type BCBS    OT Start Time 0853    OT Stop Time 0941    OT Time Calculation (min) 48 min    Equipment Utilized During Treatment --    Activity Tolerance Patient tolerated treatment well;Patient limited by pain;No increased pain;Patient limited by fatigue    Behavior During Therapy [facility] for tasks assessed/performed            History reviewed. No pertinent past medical history. History reviewed. No pertinent surgical history. Patient Active Problem List   Diagnosis Date Noted   OP (osteoporosis) 12/11/2022   Insufficiency fracture of tibia 08/30/2022   Nondisplaced fracture of lateral malleolus of left fibula, subsequent encounter for closed fracture with routine healing 08/14/2022   Osteoporosis 05/16/2021   Right knee pain 01/04/2021   Closed left cuboid fracture 11/15/2017   Hamstring tendonitis at origin 01/28/2014   HIP PAIN, RIGHT 04/27/2009   FOOT PAIN, BILATERAL 04/27/2009   BUNION, RIGHT FOOT 12/31/2007   Closed fracture of sacrum and coccyx (HCC) 12/31/2007    ONSET DATE: 01/10/24 DOS  REFERRING DIAG: F74.467 (ICD-10-CM) - Pain in left wrist   THERAPY DIAG:  Muscle weakness (generalized)  Localized edema  Pain in left wrist  Other lack of coordination  Stiffness of left wrist, not elsewhere classified  Stiffness of left hand, not elsewhere classified  Rationale for Evaluation and Treatment: Rehabilitation  PERTINENT HISTORY: left wrist distal radius fracture with intra-articular extension, comminution and notable displacement. Was also found to have small ulnar styloid fracture as well. She  underwent bedside closed reduction and splinting at that time.  she slipped and fell in a wet spa in [place] and flew home on an emergency basis, after the fracture was set and casted.  Very adventurous person with hiking, climbing, etc. She has been walking and staying active.  Her hand and fingers look very stiff today at evaluation.  Per the doctor, her wrist was very badly damaged and this injury and therapy should proceed gently.  PRECAUTIONS: None  RED FLAGS:  None   WEIGHT BEARING RESTRICTIONS: Yes no significant weightbearing in the left hand and arm for the next 4 weeks   SUBJECTIVE:   SUBJECTIVE STATEMENT: She is now 4.5 weeks s/p L DRF, ulnar styloid fxs.  She states her physician follow-up went well and her bones are healing well.  She is happy that she has been taking a more aggressive approach and does not have much pain    PAIN:  Are you having pain? Yes: NPRS scale:  0-1/10 at rest and up to 3-4/10 at worst in past week  Pain location: Left wrist Pain description: Aching Aggravating factors: Attempted movement Relieving factors: Rest and ice   PATIENT GOALS: To quickly return to full functional ability for her many planned trips and adventures  NEXT MD VISIT: 02/11/2024   OBJECTIVE: (All objective assessments below are from initial evaluation on: 01/24/24 unless otherwise specified.)   HAND DOMINANCE: Right   ADLs: Overall ADLs: States decreased ability to grab, hold household objects, pain and difficulty to open containers, perform FMS tasks (manipulate  fasteners on clothing), mild to moderate bathing problems as well.    FUNCTIONAL OUTCOME MEASURES: Eval: Patient Specific Functional Scale: 0.5 (typing, hair brush, holding wheel or eliptical)  (Higher Score  =  Better Ability for the Selected Tasks)      UPPER EXTREMITY ROM     Shoulder to Wrist AROM Left eval Lt 01/30/24 Lt 02/01/24 Lt 02/03/34 Lt 02/06/24 Lt 02/11/24  Elbow flexion 153       Elbow extension  0       Forearm supination 59 31 50 61 58 65  Forearm pronation  10 29 42 39 54 46  Wrist flexion 12 20 20 30 20  (34 PROM) 33  Wrist extension 3 15 20 23 30  32  Wrist ulnar deviation 2 10      Wrist radial deviation 9 12      Functional dart thrower's motion (F-DTM) in ulnar flexion        F-DTM in radial extension         (Blank rows = not tested)   Hand AROM Left eval Lt 02/01/24 Lt  02/04/24 Lt 02/06/24 LT 02/11/24  Full Fist Ability (or Gap to Distal Palmar Crease) 7cm gap from tip of MF to [facility] 3.7  cm gap from tip of MF to [facility] Digits 4,5 touch palm, digit 3 is close, digit 2 is most limited     Thumb Opposition  (Kapandji Scale)  6/10 8/10  9/10   Thumb MCP (0-60)   0-50    Thumb IP (0-80)   0-10    Thumb Radial Abduction Span        Thumb Palmar Abduction Span        Index MCP (0-90)    0- 77 71 0- 78  Index PIP (0-100)    (-8)- 49 67 0 -83  Index DIP (0-70)    0- 14 31 0- 38  Long MCP (0-90)        Long PIP (0-100)        Long DIP (0-70)        Ring MCP (0-90)        Ring PIP (0-100)        Ring DIP (0-70)        Little MCP (0-90)        Little PIP (0-100)        Little DIP (0-70)        (Blank rows = not tested)   UPPER EXTREMITY MMT:    Eval:  NT at eval due to recent and still healing injuries, but grossly 3 -/5 MMT in the left wrist and hand today. Will be tested when appropriate.   MMT Left TBD  Shoulder flexion   Shoulder abduction   Shoulder adduction   Shoulder extension   Shoulder internal rotation   Shoulder external rotation   Middle trapezius   Lower trapezius   Elbow flexion   Elbow extension   Forearm supination   Forearm pronation   Wrist flexion   Wrist extension   Wrist ulnar deviation   Wrist radial deviation   (Blank rows = not tested)  HAND FUNCTION: 02/11/24: grip Lt: 19.5#; Lt tip pinch 4#   (Rt was ~14#)    Eval: Observed weakness in affected left hand.  Details TBD when safe Grip strength Right: 54.5 lbs, Left: 15.5 lbs    COORDINATION: 02/06/24: 9HPT Lt 16 sec completely functional and better than average.    Eval: Observed coordination impairments  with affected Lt hand. 9 Hole Peg Test Left: 47 sec (25 sec is WFL)   SENSATION: Eval:  Light touch intact today, though mildly diminished/hypersensitive around sx area    EDEMA:   Eval:  Mildly swollen in left hand and wrist today   OBSERVATIONS:   01/30/2024: She apparently has some scar adhesion for FDP to the index finger as this finger has a difficulty extending and also difficulty actively flexing and some scar motion is seen.  Thumb is also mildly stiff and adherent.  This is somewhat shocking at only 3 weeks postop.  It may be due to the severity of her fracture and the delay with fracture to surgery.     Eval: Surgical site clean and covered by Steri-Strips,  She has some nervous guarding behaviors today, hand and thumb were very stiff.    TODAY'S TREATMENT:  02/11/24: She starts with active range of motion for exercise as well as new measures which shows some mild improvements at the wrist and forearm since last seen.  OT not only reviews her whole home exercise program with her, discussed safety with weaning orthosis for activities and proprioceptive activities, but also discusses upgraded stretching techniques including spatula stretch for a stretch on the ulnar side of the wrist, PNF stretching to do with light resistance to help with scar adhesions and any guarding tendencies.  We also discussed joint mobilizations and how she can perform this herself.  She tolerates these well with some ulnar-sided pain today that was tolerable.  We did some of these exercises with mirror feedback to help her correct her form.  She is now not wearing the orthosis much which is appropriate.  She states understanding all directions and upgrades today and leaves with no significant increase in pain.    PATIENT EDUCATION: Education details: See tx section above for  details  Person educated: Patient Education method: Verbal Instruction, Teach back, Handouts  Education comprehension: States and demonstrates understanding, Additional Education required    HOME EXERCISE PROGRAM: Access Code: [email or web address] URL: [email or web address] Date: 01/24/2024 Prepared by: Melvenia Ada   GOALS: Goals reviewed with patient? Yes   SHORT TERM GOALS: (STG required if POC>30 days) Target Date: 02/08/24  Pt will obtain protective, custom orthotic. Goal status: 01/24/24: MET  2.  Pt will demo/state understanding of initial HEP to improve pain levels and prerequisite motion. Goal status: 02/04/2024: Met   LONG TERM GOALS: Target Date: 03/21/24  Pt will improve functional ability by decreased impairment per PSFS assessment from 0.5 to 6.5 or better, for better quality of life. Goal status: INITIAL  2.  Pt will improve grip strength in Lt hand from unsafe to test to at least 40lbs for functional use at home and in IADLs. Goal status: INITIAL  3.  Pt will improve A/ROM in Lt wrist flex/ext from 12/3 degrees respectively, to at least 55* each, to have functional motion for tasks like reach and grasp.  Goal status: INITIAL  4.  Pt will improve strength in Lt hand/arm from apparent 3-/5 MMT to at least 4+/5 MMT to have increased functional ability to carry out selfcare and higher-level homecare tasks with less difficulty. Goal status: INITIAL  5.  Pt will improve coordination skills in Lt hand/arm, as seen by within functional limit score on 9HPT testing to have increased functional ability to carry out fine motor tasks (fasteners, etc.) and more complex, coordinated IADLs (meal prep, sports, etc.).  Goal status: INITIAL  6.  Pt will decrease  pain at worst from 6-7/10 to 3/10 or better to have better sleep and occupational participation in daily roles. Goal status: INITIAL   ASSESSMENT:  CLINICAL IMPRESSION: 02/11/24: She is making small  improvements, however her wrist is very stiff and OT anticipates that remaining stiff due to the severity of the damage.  To help with the stiffness, OT will make her custom mobilization orthosis in the next session, to be worn in a static progressive fashion.    PLAN:  OT FREQUENCY: 1-2x/week  OT DURATION: 8 weeks through 03/21/24 and up to 12 total visits as needed   PLANNED INTERVENTIONS: 97535 self care/ADL training, 02889 therapeutic exercise, 97530 therapeutic activity, 97112 neuromuscular re-education, 97140 manual therapy, 97035 ultrasound, 97032 electrical stimulation (manual), 97760 Orthotic Initial, [id number] Orthotic/Prosthetic subsequent, compression bandaging, Dry needling, energy conservation, coping strategies training, and patient/family education  RECOMMENDED OTHER SERVICES: none now    CONSULTED AND AGREED WITH PLAN OF CARE: Patient  PLAN FOR NEXT SESSION:   Create custom mobilization orthosis  Gustavo Dispenza Georgina, OTR/L, CHT  02/11/2024, 1:44 PM

## 2024-02-11 NOTE — Progress Notes (Signed)
   [name] - 53 y.o. female MRN [id number]  Date of birth: 1970-10-10  Office Visit Note: Visit Date: 02/11/2024 PCP: Vannie Senior, MD Referred by: Vannie Senior, MD  Subjective:  HPI: Jacqueline Michael is a 53 y.o. female who presents today for follow up 5 weeks status post open reduction internal fixation of left distal radius fracture.  She is doing well overall, has been doing occupational therapy as instructed.  Is working on range of motion per protocol.  Pertinent ROS were reviewed with the patient and found to be negative unless otherwise specified above in HPI.   Assessment & Plan: Visit Diagnoses:  1. S/P ORIF (open reduction internal fixation) fracture     Plan: She continues to do well overall.  X-rays demonstrate stable appearance of the distal radius fracture.  Continue with range of motion per protocol.  Follow-up in approximately 3 weeks for repeat clinical and radiographic check.  My goal at that point is to begin light strengthening with OT.  She expressed full understanding.  All questions were answered today.  Follow-up: No follow-ups on file.   Meds & Orders: No orders of the defined types were placed in this encounter.   Orders Placed This Encounter  Procedures   XR Wrist Complete Left     Procedures: No procedures performed       Objective:   Vital Signs: LMP 06/13/2016   Ortho Exam Left forearm: - Well-healed volar incision, no erythema or drainage - Digital range of motion is appropriate, near composite fist without significant restriction - AIN/PIN/interosseous intact - Sensation intact in median/radial/ulnar distributions - Hand remains warm well-perfused - Wrist range of motion flexion/extension 35/25 - Pronation/supination forearm 50/50, no evidence of DRUJ instability with gentle stress testing in all plane  Imaging: XR Wrist Complete Left Result Date: 02/11/2024 X-rays of the left wrist demonstrate stable appearance of the  distal radius fracture with hardware fixation.  Hardware remains well-fixed in all planes without evidence of failure or migration.  Joint line is well-maintained in both AP and lateral views.  Ulnar styloid fracture is once again visualized.    [name], M.D.  [facility] Hand [facility]

## 2024-02-12 NOTE — Therapy (Signed)
 OUTPATIENT OCCUPATIONAL THERAPY TREATMENT NOTE   Patient Name: Jacqueline Michael MRN: [id number] DOB:13-Jun-1971, 53 y.o., female Today's Date: 02/13/2024  PCP: Vannie EDISON MD REFERRING PROVIDER: Dr Arlinda    END OF SESSION:  OT End of Session - 02/13/24 1555     Visit Number 7    Number of Visits 12    Date for OT Re-Evaluation 03/21/24    Authorization Type [employer]    OT Start Time 1555    OT Stop Time 1659    OT Time Calculation (min) 64 min    Equipment Utilized During Treatment orthotic materials    Activity Tolerance Patient tolerated treatment well;Patient limited by pain;No increased pain;Patient limited by fatigue    Behavior During Therapy [facility] for tasks assessed/performed             History reviewed. No pertinent past medical history. History reviewed. No pertinent surgical history. Patient Active Problem List   Diagnosis Date Noted   OP (osteoporosis) 12/11/2022   Insufficiency fracture of tibia 08/30/2022   Nondisplaced fracture of lateral malleolus of left fibula, subsequent encounter for closed fracture with routine healing 08/14/2022   Osteoporosis 05/16/2021   Right knee pain 01/04/2021   Closed left cuboid fracture 11/15/2017   Hamstring tendonitis at origin 01/28/2014   HIP PAIN, RIGHT 04/27/2009   FOOT PAIN, BILATERAL 04/27/2009   BUNION, RIGHT FOOT 12/31/2007   Closed fracture of sacrum and coccyx (HCC) 12/31/2007    ONSET DATE: 01/10/24 DOS  REFERRING DIAG: F74.467 (ICD-10-CM) - Pain in left wrist   THERAPY DIAG:  Muscle weakness (generalized)  Localized edema  Pain in left wrist  Other lack of coordination  Stiffness of left hand, not elsewhere classified  Stiffness of left wrist, not elsewhere classified  Rationale for Evaluation and Treatment: Rehabilitation  PERTINENT HISTORY: left wrist distal radius fracture with intra-articular extension, comminution and notable displacement. Was also found to have small ulnar styloid fracture as  well. She underwent bedside closed reduction and splinting at that time.  she slipped and fell in a wet spa in [place] and flew home on an emergency basis, after the fracture was set and casted.  Very adventurous person with hiking, climbing, etc. She has been walking and staying active.  Her hand and fingers look very stiff today at evaluation.  Per the doctor, her wrist was very badly damaged and this injury and therapy should proceed gently.  PRECAUTIONS: None  RED FLAGS:  None   WEIGHT BEARING RESTRICTIONS: Yes no significant weightbearing in the left hand and arm for the next 4 weeks   SUBJECTIVE:   SUBJECTIVE STATEMENT: She is now 5 weeks s/p L DRF, ulnar styloid fxs.  She states she thinks that we pushed her arm a bit too much in the last session because she had pain for about a day and a half.  We believe that this was due to strong PNF stretches.  The pain did subside    PAIN:  Are you having pain? Yes: NPRS scale:   0-1/10 at rest and up to 3-4/10 at worst in past week  Pain location: Left wrist Pain description: Aching Aggravating factors: Attempted movement Relieving factors: Rest and ice   PATIENT GOALS: To quickly return to full functional ability for her many planned trips and adventures  NEXT MD VISIT: 02/11/2024   OBJECTIVE: (All objective assessments below are from initial evaluation on: 01/24/24 unless otherwise specified.)   HAND DOMINANCE: Right   ADLs: Overall ADLs: States decreased ability  to grab, hold household objects, pain and difficulty to open containers, perform FMS tasks (manipulate fasteners on clothing), mild to moderate bathing problems as well.    FUNCTIONAL OUTCOME MEASURES: Eval: Patient Specific Functional Scale: 0.5 (typing, hair brush, holding wheel or eliptical)  (Higher Score  =  Better Ability for the Selected Tasks)      UPPER EXTREMITY ROM     Shoulder to Wrist AROM Left eval Lt 01/30/24 Lt 02/01/24 Lt 02/03/34 Lt 02/06/24  Lt 02/11/24 LT 02/13/24  Elbow flexion 153        Elbow extension 0        Forearm supination 59 31 50 61 58 65 77  Forearm pronation  10 29 42 39 54 46 69  Wrist flexion 12 20 20 30 20  (34 PROM) 33 24  Wrist extension 3 15 20 23 30  32 32  Wrist ulnar deviation 2 10       Wrist radial deviation 9 12       Functional dart thrower's motion (F-DTM) in ulnar flexion         F-DTM in radial extension          (Blank rows = not tested)   Hand AROM Left eval Lt 02/01/24 Lt  02/04/24 Lt 02/06/24 LT 02/11/24  Full Fist Ability (or Gap to Distal Palmar Crease) 7cm gap from tip of MF to [facility] 3.7  cm gap from tip of MF to [facility] Digits 4,5 touch palm, digit 3 is close, digit 2 is most limited     Thumb Opposition  (Kapandji Scale)  6/10 8/10  9/10   Thumb MCP (0-60)   0-50    Thumb IP (0-80)   0-10    Thumb Radial Abduction Span        Thumb Palmar Abduction Span        Index MCP (0-90)    0- 77 71 0- 78  Index PIP (0-100)    (-8)- 49 67 0 -83  Index DIP (0-70)    0- 14 31 0- 38  Long MCP (0-90)        Long PIP (0-100)        Long DIP (0-70)        Ring MCP (0-90)        Ring PIP (0-100)        Ring DIP (0-70)        Little MCP (0-90)        Little PIP (0-100)        Little DIP (0-70)        (Blank rows = not tested)   UPPER EXTREMITY MMT:    Eval:  NT at eval due to recent and still healing injuries, but grossly 3 -/5 MMT in the left wrist and hand today. Will be tested when appropriate.   MMT Left TBD  Shoulder flexion   Shoulder abduction   Shoulder adduction   Shoulder extension   Shoulder internal rotation   Shoulder external rotation   Middle trapezius   Lower trapezius   Elbow flexion   Elbow extension   Forearm supination   Forearm pronation   Wrist flexion   Wrist extension   Wrist ulnar deviation   Wrist radial deviation   (Blank rows = not tested)  HAND FUNCTION: 02/11/24: grip Lt: 19.5#; Lt tip pinch 4#   (Rt was ~14#)    Eval: Observed weakness in affected left  hand.  Details TBD when safe Grip strength  Right: 54.5 lbs, Left: 15.5 lbs   COORDINATION: 02/06/24: 9HPT Lt 16 sec completely functional and better than average.    Eval: Observed coordination impairments with affected Lt hand. 9 Hole Peg Test Left: 47 sec (25 sec is WFL)   SENSATION: Eval:  Light touch intact today, though mildly diminished/hypersensitive around sx area    EDEMA:   Eval:  Mildly swollen in left hand and wrist today   OBSERVATIONS:   01/30/2024: She apparently has some scar adhesion for FDP to the index finger as this finger has a difficulty extending and also difficulty actively flexing and some scar motion is seen.  Thumb is also mildly stiff and adherent.  This is somewhat shocking at only 3 weeks postop.  It may be due to the severity of her fracture and the delay with fracture to surgery.      TODAY'S TREATMENT:  02/13/24: Today's active range of motion exercise/measures do yield excellent results for forearm supination pronation, however wrist flexion and extension remain tight and irritable.  Due to this stubborn stiffness in the wrist which may be due to scar adhesions and also capsular tightness, a custom dynamic orthosis was indicated today.  This was a complicated orthosis with hinges, elastic thread, that allows both dynamic wrist flexion and dynamic wrist extension.  It took quite a long time to fabricate, and needed to be carefully crafted to create no significant areas of pressure or pain in the arm or the hand while wearing it.  She was given copious education to wear at 3 or 4 times a day for 30 minutes at a time and either flexion or extension.  She was asked to likely perform more extension than flexion as this tends to be the more difficult range of motion.  She try also today for several minutes, stating no significant pain though some mild irritation on the ulnar styloid.  She was given extra padding to help with this if needed.  She was told to bring this  orthosis in the next session for adjustments.  OT also educates for her to continue on her home exercise programs and likely perform warm up and stretches before using the dynamic orthosis.  She states understanding and leaves in no significant or added pain    PATIENT EDUCATION: Education details: See tx section above for details  Person educated: Patient Education method: Verbal Instruction, Teach back, Handouts  Education comprehension: States and demonstrates understanding, Additional Education required    HOME EXERCISE PROGRAM: Access Code: [email or web address] URL: [email or web address] Date: 01/24/2024 Prepared by: Melvenia Ada   GOALS: Goals reviewed with patient? Yes   SHORT TERM GOALS: (STG required if POC>30 days) Target Date: 02/08/24  Pt will obtain protective, custom orthotic. Goal status: 01/24/24: MET  2.  Pt will demo/state understanding of initial HEP to improve pain levels and prerequisite motion. Goal status: 02/04/2024: Met   LONG TERM GOALS: Target Date: 03/21/24  Pt will improve functional ability by decreased impairment per PSFS assessment from 0.5 to 6.5 or better, for better quality of life. Goal status: INITIAL  2.  Pt will improve grip strength in Lt hand from unsafe to test to at least 40lbs for functional use at home and in IADLs. Goal status: INITIAL  3.  Pt will improve A/ROM in Lt wrist flex/ext from 12/3 degrees respectively, to at least 55* each, to have functional motion for tasks like reach and grasp.  Goal status: INITIAL  4.  Pt will improve strength in Lt  hand/arm from apparent 3-/5 MMT to at least 4+/5 MMT to have increased functional ability to carry out selfcare and higher-level homecare tasks with less difficulty. Goal status: INITIAL  5.  Pt will improve coordination skills in Lt hand/arm, as seen by within functional limit score on 9HPT testing to have increased functional ability to carry out fine motor tasks (fasteners,  etc.) and more complex, coordinated IADLs (meal prep, sports, etc.).  Goal status: INITIAL  6.  Pt will decrease pain at worst from 6-7/10 to 3/10 or better to have better sleep and occupational participation in daily roles. Goal status: INITIAL   ASSESSMENT:  CLINICAL IMPRESSION: 02/13/24: She now has a custom, dynamic orthosis to help with stubborn wrist stiffness in flexion and extension.  Supination forearm motions are doing excellent.   02/11/24: She is making small improvements, however her wrist is very stiff and OT anticipates that remaining stiff due to the severity of the damage.  To help with the stiffness, OT will make her custom mobilization orthosis in the next session, to be worn in a static progressive fashion.    PLAN:  OT FREQUENCY: 1-2x/week  OT DURATION: 8 weeks through 03/21/24 and up to 12 total visits as needed   PLANNED INTERVENTIONS: 97535 self care/ADL training, 02889 therapeutic exercise, 97530 therapeutic activity, 97112 neuromuscular re-education, 97140 manual therapy, 97035 ultrasound, 97032 electrical stimulation (manual), 97760 Orthotic Initial, [id number] Orthotic/Prosthetic subsequent, compression bandaging, Dry needling, energy conservation, coping strategies training, and patient/family education  RECOMMENDED OTHER SERVICES: none now    CONSULTED AND AGREED WITH PLAN OF CARE: Patient  PLAN FOR NEXT SESSION:   Check custom orthosis as needed, continue to wean from protective orthosis.  Start light wrist training/strengthening at around 7 or 8 weeks postop.  Melvenia Ada, OTR/L, CHT  02/13/2024, 5:10 PM

## 2024-02-13 ENCOUNTER — Encounter: Payer: Self-pay | Admitting: Rehabilitative and Restorative Service Providers"

## 2024-02-13 ENCOUNTER — Ambulatory Visit (INDEPENDENT_AMBULATORY_CARE_PROVIDER_SITE_OTHER): Admitting: Rehabilitative and Restorative Service Providers"

## 2024-02-13 DIAGNOSIS — M6281 Muscle weakness (generalized): Secondary | ICD-10-CM | POA: Diagnosis not present

## 2024-02-13 DIAGNOSIS — R600 Localized edema: Secondary | ICD-10-CM

## 2024-02-13 DIAGNOSIS — M25632 Stiffness of left wrist, not elsewhere classified: Secondary | ICD-10-CM

## 2024-02-13 DIAGNOSIS — R278 Other lack of coordination: Secondary | ICD-10-CM | POA: Diagnosis not present

## 2024-02-13 DIAGNOSIS — M25532 Pain in left wrist: Secondary | ICD-10-CM | POA: Diagnosis not present

## 2024-02-13 DIAGNOSIS — M25642 Stiffness of left hand, not elsewhere classified: Secondary | ICD-10-CM

## 2024-02-19 ENCOUNTER — Ambulatory Visit (INDEPENDENT_AMBULATORY_CARE_PROVIDER_SITE_OTHER): Admitting: Rehabilitative and Restorative Service Providers"

## 2024-02-19 ENCOUNTER — Encounter: Payer: Self-pay | Admitting: Rehabilitative and Restorative Service Providers"

## 2024-02-19 DIAGNOSIS — R600 Localized edema: Secondary | ICD-10-CM | POA: Diagnosis not present

## 2024-02-19 DIAGNOSIS — M25632 Stiffness of left wrist, not elsewhere classified: Secondary | ICD-10-CM

## 2024-02-19 DIAGNOSIS — R278 Other lack of coordination: Secondary | ICD-10-CM | POA: Diagnosis not present

## 2024-02-19 DIAGNOSIS — M25642 Stiffness of left hand, not elsewhere classified: Secondary | ICD-10-CM

## 2024-02-19 DIAGNOSIS — M6281 Muscle weakness (generalized): Secondary | ICD-10-CM | POA: Diagnosis not present

## 2024-02-19 DIAGNOSIS — M25532 Pain in left wrist: Secondary | ICD-10-CM | POA: Diagnosis not present

## 2024-02-19 NOTE — Therapy (Signed)
 OUTPATIENT OCCUPATIONAL THERAPY TREATMENT NOTE   Patient Name: Jacqueline Michael MRN: [id number] DOB:Apr 21, 1971, 53 y.o., female Today's Date: 02/19/2024  PCP: Vannie EDISON MD REFERRING PROVIDER: Dr Arlinda    END OF SESSION:  OT End of Session - 02/19/24 1555     Visit Number 8    Number of Visits 12    Date for OT Re-Evaluation 03/21/24    Authorization Type BCBS    OT Start Time 1555    OT Stop Time 1649    OT Time Calculation (min) 54 min    Equipment Utilized During Treatment orthotic materials    Activity Tolerance Patient tolerated treatment well;Patient limited by pain;No increased pain;Patient limited by fatigue    Behavior During Therapy [facility] for tasks assessed/performed              History reviewed. No pertinent past medical history. History reviewed. No pertinent surgical history. Patient Active Problem List   Diagnosis Date Noted   OP (osteoporosis) 12/11/2022   Insufficiency fracture of tibia 08/30/2022   Nondisplaced fracture of lateral malleolus of left fibula, subsequent encounter for closed fracture with routine healing 08/14/2022   Osteoporosis 05/16/2021   Right knee pain 01/04/2021   Closed left cuboid fracture 11/15/2017   Hamstring tendonitis at origin 01/28/2014   HIP PAIN, RIGHT 04/27/2009   FOOT PAIN, BILATERAL 04/27/2009   BUNION, RIGHT FOOT 12/31/2007   Closed fracture of sacrum and coccyx (HCC) 12/31/2007    ONSET DATE: 01/10/24 DOS  REFERRING DIAG: F74.467 (ICD-10-CM) - Pain in left wrist   THERAPY DIAG:  Muscle weakness (generalized)  Localized edema  Other lack of coordination  Pain in left wrist  Stiffness of left hand, not elsewhere classified  Stiffness of left wrist, not elsewhere classified  Rationale for Evaluation and Treatment: Rehabilitation  PERTINENT HISTORY: left wrist distal radius fracture with intra-articular extension, comminution and notable displacement. Was also found to have small ulnar styloid fracture  as well. She underwent bedside closed reduction and splinting at that time.  she slipped and fell in a wet spa in [place] and flew home on an emergency basis, after the fracture was set and casted.  Very adventurous person with hiking, climbing, etc. She has been walking and staying active.  Her hand and fingers look very stiff today at evaluation.  Per the doctor, her wrist was very badly damaged and this injury and therapy should proceed gently.  PRECAUTIONS: None  RED FLAGS:  None   WEIGHT BEARING RESTRICTIONS: Yes no significant weightbearing in the left hand and arm for the next 4 weeks   SUBJECTIVE:   SUBJECTIVE STATEMENT: She is now 6 weeks s/p L DRF, ulnar styloid fxs.  She states that she is back to her running routine, she is being more active and that the new orthosis is not hurting her but it does need some adjustment because it did make her feel like she was going numb.      PAIN:  Are you having pain? Yes: NPRS scale:    0-1/10 at rest and up to 3-4/10 at worst in past week  Pain location: Left wrist Pain description: Aching Aggravating factors: Attempted movement Relieving factors: Rest and ice   PATIENT GOALS: To quickly return to full functional ability for her many planned trips and adventures  NEXT MD VISIT: 02/11/2024   OBJECTIVE: (All objective assessments below are from initial evaluation on: 01/24/24 unless otherwise specified.)   HAND DOMINANCE: Right   ADLs: Overall ADLs: States decreased  ability to grab, hold household objects, pain and difficulty to open containers, perform FMS tasks (manipulate fasteners on clothing), mild to moderate bathing problems as well.    FUNCTIONAL OUTCOME MEASURES: Eval: Patient Specific Functional Scale: 0.5 (typing, hair brush, holding wheel or eliptical)  (Higher Score  =  Better Ability for the Selected Tasks)      UPPER EXTREMITY ROM     Shoulder to Wrist AROM Left eval Lt 01/30/24 Lt 02/01/24 Lt 02/03/34 Lt 02/06/24  Lt 02/11/24 LT 02/13/24 Lt 02/19/24  Elbow flexion 153         Elbow extension 0         Forearm supination 59 31 50 61 58 65 77 79  Forearm pronation  10 29 42 39 54 46 69 50  Wrist flexion 12 20 20 30 20  (34 PROM) 33 24 38  Wrist extension 3 15 20 23 30  32 32 41  Wrist ulnar deviation 2 10        Wrist radial deviation 9 12        Functional dart thrower's motion (F-DTM) in ulnar flexion          F-DTM in radial extension           (Blank rows = not tested)   Hand AROM Left eval Lt 02/01/24 Lt  02/04/24 Lt 02/06/24 LT 02/11/24 Lt 02/19/24  Full Fist Ability (or Gap to Distal Palmar Crease) 7cm gap from tip of MF to [facility] 3.7  cm gap from tip of MF to [facility] Digits 4,5 touch palm, digit 3 is close, digit 2 is most limited      Thumb Opposition  (Kapandji Scale)  6/10 8/10  9/10    Thumb MCP (0-60)   0-50     Thumb IP (0-80)   0-10     Thumb Radial Abduction Span         Thumb Palmar Abduction Span         Index MCP (0-90)    0- 77 71 0- 78 0- 75  Index PIP (0-100)    (-8)- 49 67 0 -83 0- 91  Index DIP (0-70)    0- 14 31 0- 38 0- 46  Long MCP (0-90)         Long PIP (0-100)         Long DIP (0-70)         Ring MCP (0-90)         Ring PIP (0-100)         Ring DIP (0-70)         Little MCP (0-90)         Little PIP (0-100)         Little DIP (0-70)         (Blank rows = not tested)   UPPER EXTREMITY MMT:    Eval:  NT at eval due to recent and still healing injuries, but grossly 3 -/5 MMT in the left wrist and hand today. Will be tested when appropriate.   MMT Left TBD  Shoulder flexion   Shoulder abduction   Shoulder adduction   Shoulder extension   Shoulder internal rotation   Shoulder external rotation   Middle trapezius   Lower trapezius   Elbow flexion   Elbow extension   Forearm supination   Forearm pronation   Wrist flexion   Wrist extension   Wrist ulnar deviation   Wrist radial deviation   (Blank rows =  not tested)  HAND FUNCTION: 02/25/24: Grip Lt TBD#, tip  pinch TBD#    02/11/24: grip Lt: 19.5#; Lt tip pinch 4#   (Rt was ~14#)    Eval: Observed weakness in affected left hand.  Details TBD when safe Grip strength Right: 54.5 lbs, Left: 15.5 lbs   COORDINATION: 02/06/24: 9HPT Lt 16 sec completely functional and better than average.    Eval: Observed coordination impairments with affected Lt hand. 9 Hole Peg Test Left: 47 sec (25 sec is WFL)   SENSATION: Eval:  Light touch intact today, though mildly diminished/hypersensitive around sx area    EDEMA:   Eval:  Mildly swollen in left hand and wrist today   OBSERVATIONS:   01/30/2024: She apparently has some scar adhesion for FDP to the index finger as this finger has a difficulty extending and also difficulty actively flexing and some scar motion is seen.  Thumb is also mildly stiff and adherent.  This is somewhat shocking at only 3 weeks postop.  It may be due to the severity of her fracture and the delay with fracture to surgery.      TODAY'S TREATMENT:  02/19/24: OT modifies her orthosis to add some padding and remove some material to give a better stretch and she does state that it fits better now.  She performs active range of motion for exercise as well as new measures which shows excellent new improvement at the wrist in flexion and extension, likely at least in part to the new orthosis.  We review how to wear and use that and she is doing well with it.  We also review her home exercise program, given new ideas for upgraded and dynamic stretches (incorporating both forearm rotation and wrist motion as tolerated), stretching the thumb with a palm splaying-type stretch, and performing functional activities to promote pronation and supination.  We reviewed putty exercises and activities, and OT discusses with her trying to incorporate the left arm in all functional activities that way less than 5 to 10 pounds and are not painful for her.  We also trial in hand manipulation activities today to  work on Engineer, agricultural.  Additionally we reviewed scar mobilizations and she does have a small area that may be a bone fragment under her skin but fortunately is not painful for her.  She can ask the hand surgeon about this to be sure.  OT educates on waitress carry nerve gliding to be done as an active stretch through the elbow, forearm, wrist and hand.  She states feeling this dynamic stretch.  We reviewed scar mobilization techniques and she is doing well with cupping.  She was encouraged to continue though grind by doing time on stretches and using and moving her hand, continuing to wean from the orthosis as comfortable.  She was encouraged to monitor pronation ability as this did stiffen up slightly today.  If tolerated, she can get back into PNF stretching as this was not well-tolerated in weeks past.  She leaves without further questions or significant pain.    PATIENT EDUCATION: Education details: See tx section above for details  Person educated: Patient Education method: Verbal Instruction, Teach back, Handouts  Education comprehension: States and demonstrates understanding, Additional Education required    HOME EXERCISE PROGRAM: Access Code: [email or web address] URL: [email or web address] Date: 01/24/2024 Prepared by: Melvenia Ada   GOALS: Goals reviewed with patient? Yes   SHORT TERM GOALS: (STG required if POC>30 days) Target Date: 02/08/24  Pt will obtain protective, custom  orthotic. Goal status: 01/24/24: MET  2.  Pt will demo/state understanding of initial HEP to improve pain levels and prerequisite motion. Goal status: 02/04/2024: Met   LONG TERM GOALS: Target Date: 03/21/24  Pt will improve functional ability by decreased impairment per PSFS assessment from 0.5 to 6.5 or better, for better quality of life. Goal status: INITIAL  2.  Pt will improve grip strength in Lt hand from unsafe to test to at least 40lbs for functional use at home and in  IADLs. Goal status: INITIAL  3.  Pt will improve A/ROM in Lt wrist flex/ext from 12/3 degrees respectively, to at least 55* each, to have functional motion for tasks like reach and grasp.  Goal status: INITIAL  4.  Pt will improve strength in Lt hand/arm from apparent 3-/5 MMT to at least 4+/5 MMT to have increased functional ability to carry out selfcare and higher-level homecare tasks with less difficulty. Goal status: INITIAL  5.  Pt will improve coordination skills in Lt hand/arm, as seen by within functional limit score on 9HPT testing to have increased functional ability to carry out fine motor tasks (fasteners, etc.) and more complex, coordinated IADLs (meal prep, sports, etc.).  Goal status: INITIAL  6.  Pt will decrease pain at worst from 6-7/10 to 3/10 or better to have better sleep and occupational participation in daily roles. Goal status: INITIAL   ASSESSMENT:  CLINICAL IMPRESSION: 02/19/24: She is doing very well and the new orthosis seems to have unlocked new motion and wrist flexion and extension for her.  We are making small tweaks and adjustments/modifications though OT does not want to start strengthening too early as this could setback her healing.  She may be appropriate for strength in the next 1 to 2 weeks.   02/13/24: She now has a custom, dynamic orthosis to help with stubborn wrist stiffness in flexion and extension.  Supination forearm motions are doing excellent.   02/11/24: She is making small improvements, however her wrist is very stiff and OT anticipates that remaining stiff due to the severity of the damage.  To help with the stiffness, OT will make her custom mobilization orthosis in the next session, to be worn in a static progressive fashion.    PLAN:  OT FREQUENCY: 1-2x/week  OT DURATION: 8 weeks through 03/21/24 and up to 12 total visits as needed   PLANNED INTERVENTIONS: 97535 self care/ADL training, 02889 therapeutic exercise, 97530 therapeutic  activity, 97112 neuromuscular re-education, 97140 manual therapy, 97035 ultrasound, 97032 electrical stimulation (manual), 97760 Orthotic Initial, 97763 Orthotic/Prosthetic subsequent, compression bandaging, Dry needling, energy conservation, coping strategies training, and patient/family education  RECOMMENDED OTHER SERVICES: none now    CONSULTED AND AGREED WITH PLAN OF CARE: Patient  PLAN FOR NEXT SESSION:   Get a good check of strength at the arm and forearm, wrist, hand and pinch-start light strengthening in the next 1 to 2 weeks if mobility is still improving.  Discuss PSFS  Melvenia Ada, OTR/L, CHT  02/19/2024, 5:04 PM

## 2024-02-21 NOTE — Therapy (Signed)
 OUTPATIENT OCCUPATIONAL THERAPY TREATMENT NOTE   Patient Name: Jacqueline Michael MRN: [id number] DOB:06-05-1971, 53 y.o., female Today's Date: 02/25/2024  PCP: Vannie EDISON MD REFERRING PROVIDER: Dr Arlinda    END OF SESSION:  OT End of Session - 02/25/24 1430     Visit Number 9    Number of Visits 12    Date for OT Re-Evaluation 03/21/24    Authorization Type BCBS    OT Start Time 1430    OT Stop Time 1517    OT Time Calculation (min) 47 min    Equipment Utilized During Treatment green putty, red t-band    Activity Tolerance Patient tolerated treatment well;Patient limited by pain;No increased pain;Patient limited by fatigue    Behavior During Therapy [facility] for tasks assessed/performed           History reviewed. No pertinent past medical history. History reviewed. No pertinent surgical history. Patient Active Problem List   Diagnosis Date Noted   OP (osteoporosis) 12/11/2022   Insufficiency fracture of tibia 08/30/2022   Nondisplaced fracture of lateral malleolus of left fibula, subsequent encounter for closed fracture with routine healing 08/14/2022   Osteoporosis 05/16/2021   Right knee pain 01/04/2021   Closed left cuboid fracture 11/15/2017   Hamstring tendonitis at origin 01/28/2014   HIP PAIN, RIGHT 04/27/2009   FOOT PAIN, BILATERAL 04/27/2009   BUNION, RIGHT FOOT 12/31/2007   Closed fracture of sacrum and coccyx (HCC) 12/31/2007    ONSET DATE: 01/10/24 DOS  REFERRING DIAG: F74.467 (ICD-10-CM) - Pain in left wrist   THERAPY DIAG:  Muscle weakness (generalized)  Localized edema  Other lack of coordination  Pain in left wrist  Stiffness of left hand, not elsewhere classified  Stiffness of left wrist, not elsewhere classified  Rationale for Evaluation and Treatment: Rehabilitation  PERTINENT HISTORY: left wrist distal radius fracture with intra-articular extension, comminution and notable displacement. Was also found to have small ulnar styloid fracture  as well. She underwent bedside closed reduction and splinting at that time.  she slipped and fell in a wet spa in [place] and flew home on an emergency basis, after the fracture was set and casted.  Very adventurous person with hiking, climbing, etc. She has been walking and staying active.  Her hand and fingers look very stiff today at evaluation.  Per the doctor, her wrist was very badly damaged and this injury and therapy should proceed gently.  PRECAUTIONS: None  RED FLAGS:  None   WEIGHT BEARING RESTRICTIONS: Yes no significant weightbearing in the left hand and arm for the next 4 weeks   SUBJECTIVE:   SUBJECTIVE STATEMENT: She is now 6+ weeks s/p L DRF, ulnar styloid fxs.  She states is borrowing a JAS FA rotation brace from a friend and wants to see if it's made changes.      PAIN:  Are you having pain? Yes: NPRS scale:    01/10 at rest and up to 3-4/10 at worst in past week  Pain location: Left wrist Pain description: Aching Aggravating factors: Attempted movement Relieving factors: Rest and ice   PATIENT GOALS: To quickly return to full functional ability for her many planned trips and adventures  NEXT MD VISIT: 02/11/2024   OBJECTIVE: (All objective assessments below are from initial evaluation on: 01/24/24 unless otherwise specified.)   HAND DOMINANCE: Right   ADLs: Overall ADLs: States decreased ability to grab, hold household objects, pain and difficulty to open containers, perform FMS tasks (manipulate fasteners on clothing), mild to moderate  bathing problems as well.    FUNCTIONAL OUTCOME MEASURES: 02/25/24:  PSFS: 5.6   Eval: Patient Specific Functional Scale: 0.5 (typing, hair brush, holding wheel or eliptical)  (Higher Score  =  Better Ability for the Selected Tasks)      UPPER EXTREMITY ROM     Shoulder to Wrist AROM Left eval Lt 01/30/24 Lt 02/01/24 Lt 02/03/34 Lt 02/06/24 Lt 02/11/24 LT 02/13/24 Lt 02/19/24 Lt 02/25/24  Elbow flexion 153          Elbow  extension 0          Forearm supination 59 31 50 61 58 65 77 79 82  Forearm pronation  10 29 42 39 54 46 69 50 50  Wrist flexion 12 20 20 30 20  (34 PROM) 33 24 38 43  Wrist extension 3 15 20 23 30  32 32 41 47  Wrist ulnar deviation 2 10         Wrist radial deviation 9 12         Functional dart thrower's motion (F-DTM) in ulnar flexion           F-DTM in radial extension            (Blank rows = not tested)   Hand AROM Left eval Lt 02/01/24 Lt  02/04/24 Lt 02/06/24 LT 02/11/24 Lt 02/19/24 Lt 02/25/24  Full Fist Ability (or Gap to Distal Palmar Crease) 7cm gap from tip of MF to [facility] 3.7  cm gap from tip of MF to [facility] Digits 4,5 touch palm, digit 3 is close, digit 2 is most limited       Thumb Opposition  (Kapandji Scale)  6/10 8/10  9/10     Thumb MCP (0-60)   0-50      Thumb IP (0-80)   0-10      Thumb Radial Abduction Span          Thumb Palmar Abduction Span          Index MCP (0-90)    0- 77 71 0- 78 0- 75 0- 62  Index PIP (0-100)    (-8)- 49 67 0 -83 0- 91 0- 92  Index DIP (0-70)    0- 14 31 0- 38 0- 46 0- 61  Long MCP (0-90)          Long PIP (0-100)          Long DIP (0-70)          Ring MCP (0-90)          Ring PIP (0-100)          Ring DIP (0-70)          Little MCP (0-90)          Little PIP (0-100)          Little DIP (0-70)          (Blank rows = not tested)   UPPER EXTREMITY MMT:     MMT Left 02/25/24  Shoulder flexion   Shoulder abduction   Shoulder adduction   Shoulder extension   Shoulder internal rotation   Shoulder external rotation   Middle trapezius   Lower trapezius   Elbow flexion   Elbow extension   Forearm supination 4+/5  Forearm pronation 4/5 slightly tender  Wrist flexion 4+/5   Wrist extension 4-/5  Wrist ulnar deviation 4-/5 tender  Wrist radial deviation 4/5 tender  (Blank rows = not tested)  HAND FUNCTION: 02/25/24:  Grip Lt 26.7#, tip pinch 6#    02/11/24: grip Lt: 19.5#; Lt tip pinch 4#   (Rt was ~14#)    Eval: Observed weakness  in affected left hand.  Grip strength Right: 54.5 lbs, Left: 15.5 lbs   COORDINATION: 02/06/24: 9HPT Lt 16 sec completely functional and better than average.    Eval: Observed coordination impairments with affected Lt hand. 9 Hole Peg Test Left: 47 sec (25 sec is WFL)   SENSATION: Eval:  Light touch intact today, though mildly diminished/hypersensitive around sx area    EDEMA:   Eval:  Mildly swollen in left hand and wrist today   OBSERVATIONS:   01/30/2024: She apparently has some scar adhesion for FDP to the index finger as this finger has a difficulty extending and also difficulty actively flexing and some scar motion is seen.  Thumb is also mildly stiff and adherent.  This is somewhat shocking at only 3 weeks postop.  It may be due to the severity of her fracture and the delay with fracture to surgery.      TODAY'S TREATMENT:  02/25/24: She starts with active range of motion for exercise as well as new measures which shows improvements at the forearm and the wrist.  This is highly encouraging, though she was educated and recommended to work carefully on forearm pronation.  OT helps her fit into her JAS brace, helping size R, educating on how to perform this gently.  We briefly reviewed the use of her dynamic orthosis as well.  We also review safety concerns including not lifting pushing pulling anything very heavy or painful and avoiding sharp joint pain or bone pain where her bones are not quite healed yet (especially ulnar styloid area).  Her therapy putty activities were reviewed and upgraded to use green therapy putty now.  She tolerates it well.  OT also tests her manual muscle testing/strength today, and she has very little tenderness in an isometric fashion today, so we reviewed PNF stretches and she was recommended to do these as tolerated at the forearm and the wrist, and also given red therapy band and shown how to do wrist extension and flexion non-painfully, in a neutral position  for isokinetic strengthening.  She was told to only do strengthening once or twice a day but still focus on mobility as a priority.  It is possible that strengthening with a therapy band in a nonpainful way may help with some of her scar adhesions and improve mobility, though typically strengthening does not improve mobility but rather decrease it as these muscles are contracting.  This was explained.  She states understanding all directions and leaves in no significant pain at the end of the session.    PATIENT EDUCATION: Education details: See tx section above for details  Person educated: Patient Education method: Verbal Instruction, Teach back, Handouts  Education comprehension: States and demonstrates understanding, Additional Education required    HOME EXERCISE PROGRAM: Access Code: [email or web address] URL: [email or web address] Date: 01/24/2024 Prepared by: Melvenia Ada   GOALS: Goals reviewed with patient? Yes   SHORT TERM GOALS: (STG required if POC>30 days) Target Date: 02/08/24  Pt will obtain protective, custom orthotic. Goal status: 01/24/24: MET  2.  Pt will demo/state understanding of initial HEP to improve pain levels and prerequisite motion. Goal status: 02/04/2024: Met   LONG TERM GOALS: Target Date: 03/21/24  Pt will improve functional ability by decreased impairment per PSFS assessment from 0.5 to 6.5 or better, for better quality of life.  Goal status: INITIAL  2.  Pt will improve grip strength in Lt hand from unsafe to test to at least 40lbs for functional use at home and in IADLs. Goal status: INITIAL  3.  Pt will improve A/ROM in Lt wrist flex/ext from 12/3 degrees respectively, to at least 55* each, to have functional motion for tasks like reach and grasp.  Goal status: INITIAL  4.  Pt will improve strength in Lt hand/arm from apparent 3-/5 MMT to at least 4+/5 MMT to have increased functional ability to carry out selfcare and higher-level  homecare tasks with less difficulty. Goal status: INITIAL  5.  Pt will improve coordination skills in Lt hand/arm, as seen by within functional limit score on 9HPT testing to have increased functional ability to carry out fine motor tasks (fasteners, etc.) and more complex, coordinated IADLs (meal prep, sports, etc.).  Goal status: INITIAL  6.  Pt will decrease pain at worst from 6-7/10 to 3/10 or better to have better sleep and occupational participation in daily roles. Goal status: INITIAL   ASSESSMENT:  CLINICAL IMPRESSION: 02/25/24: OT was fairly surprised that she had little to no tenderness or pain during manual muscle testing today, even at often sores ulnar styloid area.  Due to that, OT allows for some early strengthening but this must be done without much repetition or pain.  She should still focus on mobility.  Her dynamic orthosis is still working, JAS brace may be of benefit to her, and if she has to return it to her friend that loaned it to her she may wish to get her which will need doctor's clearance and approval to put through for insurance purposes.     PLAN:  OT FREQUENCY: 1-2x/week  OT DURATION: 8 weeks through 03/21/24 and up to 12 total visits as needed   PLANNED INTERVENTIONS: 97535 self care/ADL training, 02889 therapeutic exercise, 97530 therapeutic activity, 97112 neuromuscular re-education, 97140 manual therapy, 97035 ultrasound, 97032 electrical stimulation (manual), 97760 Orthotic Initial, [id number] Orthotic/Prosthetic subsequent, compression bandaging, Dry needling, energy conservation, coping strategies training, and patient/family education  RECOMMENDED OTHER SERVICES: none now    CONSULTED AND AGREED WITH PLAN OF CARE: Patient  PLAN FOR NEXT SESSION:   Check light strengthening, check motion and mobility as a priority.  Start some dynamic strengthening and functional endurance like UB E as well as long as tolerated well  Melvenia Ada, OTR/L, CHT   02/25/2024, 3:31 PM

## 2024-02-25 ENCOUNTER — Encounter: Admitting: Rehabilitative and Restorative Service Providers"

## 2024-02-25 ENCOUNTER — Ambulatory Visit (INDEPENDENT_AMBULATORY_CARE_PROVIDER_SITE_OTHER): Admitting: Rehabilitative and Restorative Service Providers"

## 2024-02-25 ENCOUNTER — Encounter: Payer: Self-pay | Admitting: Rehabilitative and Restorative Service Providers"

## 2024-02-25 DIAGNOSIS — R278 Other lack of coordination: Secondary | ICD-10-CM

## 2024-02-25 DIAGNOSIS — M6281 Muscle weakness (generalized): Secondary | ICD-10-CM | POA: Diagnosis not present

## 2024-02-25 DIAGNOSIS — M25532 Pain in left wrist: Secondary | ICD-10-CM

## 2024-02-25 DIAGNOSIS — M25632 Stiffness of left wrist, not elsewhere classified: Secondary | ICD-10-CM

## 2024-02-25 DIAGNOSIS — R600 Localized edema: Secondary | ICD-10-CM

## 2024-02-25 DIAGNOSIS — M25642 Stiffness of left hand, not elsewhere classified: Secondary | ICD-10-CM

## 2024-02-29 NOTE — Therapy (Signed)
 OUTPATIENT OCCUPATIONAL THERAPY TREATMENT NOTE   Patient Name: Jacqueline Michael MRN: [id number] DOB:April 22, 1971, 53 y.o., female Today's Date: 03/04/2024  PCP: Vannie EDISON MD REFERRING PROVIDER: Dr Arlinda    END OF SESSION:  OT End of Session - 03/04/24 1046     Visit Number 10    Number of Visits 12    Date for OT Re-Evaluation 03/21/24    Authorization Type [employer]    OT Start Time 1034    OT Stop Time 1103    OT Time Calculation (min) 29 min    Equipment Utilized During Treatment green putty, red t-band    Activity Tolerance Patient tolerated treatment well;Patient limited by pain;No increased pain;Patient limited by fatigue    Behavior During Therapy [facility] for tasks assessed/performed            History reviewed. No pertinent past medical history. History reviewed. No pertinent surgical history. Patient Active Problem List   Diagnosis Date Noted   OP (osteoporosis) 12/11/2022   Insufficiency fracture of tibia 08/30/2022   Nondisplaced fracture of lateral malleolus of left fibula, subsequent encounter for closed fracture with routine healing 08/14/2022   Osteoporosis 05/16/2021   Right knee pain 01/04/2021   Closed left cuboid fracture 11/15/2017   Hamstring tendonitis at origin 01/28/2014   HIP PAIN, RIGHT 04/27/2009   FOOT PAIN, BILATERAL 04/27/2009   BUNION, RIGHT FOOT 12/31/2007   Closed fracture of sacrum and coccyx (HCC) 12/31/2007    ONSET DATE: 01/10/24 DOS  REFERRING DIAG: F74.467 (ICD-10-CM) - Pain in left wrist   THERAPY DIAG:  Muscle weakness (generalized)  Localized edema  Other lack of coordination  Stiffness of left hand, not elsewhere classified  Pain in left wrist  Stiffness of left wrist, not elsewhere classified  Rationale for Evaluation and Treatment: Rehabilitation  PERTINENT HISTORY: left wrist distal radius fracture with intra-articular extension, comminution and notable displacement. Was also found to have small ulnar styloid  fracture as well. She underwent bedside closed reduction and splinting at that time.  she slipped and fell in a wet spa in [place] and flew home on an emergency basis, after the fracture was set and casted.  Very adventurous person with hiking, climbing, etc. She has been walking and staying active.  Her hand and fingers look very stiff today at evaluation.  Per the doctor, her wrist was very badly damaged and this injury and therapy should proceed gently.  PRECAUTIONS: None  RED FLAGS:  None   WEIGHT BEARING RESTRICTIONS: Yes no significant weightbearing in the left hand and arm for the next 4 weeks   SUBJECTIVE:   SUBJECTIVE STATEMENT: She is now ~8 weeks s/p L DRF, ulnar styloid fxs.  We got a late start for therapy due to her seeing the physician first.  He states she is solid and can proceed as tolerated.  She states doing fine   PAIN:  Are you having pain? Yes: NPRS scale:  0/10 at rest and up to 2-3/10 at worst in past week  Pain location: Left wrist Pain description: Aching Aggravating factors: Attempted movement Relieving factors: Rest and ice   PATIENT GOALS: To quickly return to full functional ability for her many planned trips and adventures  NEXT MD VISIT: 02/11/2024   OBJECTIVE: (All objective assessments below are from initial evaluation on: 01/24/24 unless otherwise specified.)   HAND DOMINANCE: Right   ADLs: Overall ADLs: States her mobility and work ability is improving, though typing is still difficult due to poor pronation  FUNCTIONAL OUTCOME MEASURES: 02/25/24:  PSFS: 5.6   Eval: Patient Specific Functional Scale: 0.5 (typing, hair brush, holding wheel or eliptical)  (Higher Score  =  Better Ability for the Selected Tasks)      UPPER EXTREMITY ROM     Shoulder to Wrist AROM Left eval Lt 01/30/24 Lt 02/01/24 Lt 02/03/34 Lt 02/06/24 Lt 02/11/24 LT 02/13/24 Lt 02/19/24 Lt 02/25/24 Lt 03/04/24  Elbow flexion 153           Elbow extension 0            Forearm supination 59 31 50 61 58 65 77 79 82 83   Forearm pronation  10 29 42 39 54 46 69 50 50 45   Wrist flexion 12 20 20 30 20  (34 PROM) 33 24 38 43 57  Wrist extension 3 15 20 23 30  32 32 41 47 51  Wrist ulnar deviation 2 10          Wrist radial deviation 9 12          Functional dart thrower's motion (F-DTM) in ulnar flexion            F-DTM in radial extension             (Blank rows = not tested)   Hand AROM Left eval Lt 02/01/24 Lt  02/04/24 Lt 02/06/24 LT 02/11/24 Lt 02/19/24 Lt 02/25/24  Full Fist Ability (or Gap to Distal Palmar Crease) 7cm gap from tip of MF to [facility] 3.7  cm gap from tip of MF to [facility] Digits 4,5 touch palm, digit 3 is close, digit 2 is most limited       Thumb Opposition  (Kapandji Scale)  6/10 8/10  9/10     Thumb MCP (0-60)   0-50      Thumb IP (0-80)   0-10      Thumb Radial Abduction Span          Thumb Palmar Abduction Span          Index MCP (0-90)    0- 77 71 0- 78 0- 75 0- 62  Index PIP (0-100)    (-8)- 49 67 0 -83 0- 91 0- 92  Index DIP (0-70)    0- 14 31 0- 38 0- 46 0- 61  Long MCP (0-90)          Long PIP (0-100)          Long DIP (0-70)          Ring MCP (0-90)          Ring PIP (0-100)          Ring DIP (0-70)          Little MCP (0-90)          Little PIP (0-100)          Little DIP (0-70)          (Blank rows = not tested)   UPPER EXTREMITY MMT:     MMT Left 02/25/24  Shoulder flexion   Shoulder abduction   Shoulder adduction   Shoulder extension   Shoulder internal rotation   Shoulder external rotation   Middle trapezius   Lower trapezius   Elbow flexion   Elbow extension   Forearm supination 4+/5  Forearm pronation 4/5 slightly tender  Wrist flexion 4+/5   Wrist extension 4-/5  Wrist ulnar deviation 4-/5 tender  Wrist radial deviation 4/5 tender  (Blank rows = not  tested)  HAND FUNCTION: 02/25/24: Grip Lt 26.7#, tip pinch 6#    02/11/24: grip Lt: 19.5#; Lt tip pinch 4#   (Rt was ~14#)    Eval: Observed weakness in  affected left hand.  Grip strength Right: 54.5 lbs, Left: 15.5 lbs   COORDINATION: 02/06/24: 9HPT Lt 16 sec completely functional and better than average.    Eval: Observed coordination impairments with affected Lt hand. 9 Hole Peg Test Left: 47 sec (25 sec is WFL)   SENSATION: Eval:  Light touch intact today, though mildly diminished/hypersensitive around sx area    EDEMA:   Eval:  Mildly swollen in left hand and wrist today   OBSERVATIONS:   01/30/2024: She apparently has some scar adhesion for FDP to the index finger as this finger has a difficulty extending and also difficulty actively flexing and some scar motion is seen.  Thumb is also mildly stiff and adherent.  This is somewhat shocking at only 3 weeks postop.  It may be due to the severity of her fracture and the delay with fracture to surgery.      TODAY'S TREATMENT:  03/04/24: New active range of motion exercises yield better measurements today at the wrist.  Unfortunately pronation is not looser after her using her JAS brace so she was recommended to not necessarily use this anymore if it is not helpful.  We review her current home exercises and stretches quickly, and OT educates on several new strengthening that can be done as bolded below.  She tolerates this well, was given green therapy band to help with this, but she can also use light tolerable hand weights.  Safety was stressed, and if she cannot do 2 sets of 15 repetitions she should not move up and wait.  She should respect her wrist as a limiting factor when starting whole upper body strengthening.  She states understanding all directions and leaves without complaints or questions    Exercises - Forearm Supination Stretch  - 3-4 x daily - 3-5 reps - 15 sec hold - Forearm Pronation Stretch  - 3-4 x daily - 3-5 reps - 15 sec hold - Wrist Extension Stretch Pronated  - 4 x daily - 3-5 reps - 15 hold - Wrist Flexion Stretch  - 4 x daily - 3-5 reps - 15 sec hold - BACK  KNUCKLE STRETCHES   - 4 x daily - 3-5 reps - 15 sec hold - HOOK Stretch  - 4 x daily - 3-5 reps - 15-20 sec hold - Seated Finger Composite Flexion Stretch  - 4 x daily - 3-5 reps - 15 hold - Thumb stretch  - 4 x daily - 3-5 reps - 15-20 sec hold - Tendon Glides  - 3-4 x daily - 3-5 reps - 2-3 seconds hold - Full Fist  - 1-2 x daily - 4-6 x weekly - 5 reps - 10 sec hold - Thumb Opposition with Putty  - 1-2 x daily - 5 reps - 10 sec hold - Thumb Press  - 1-2 x daily - 5 reps - 10 sec hold - Standing Shoulder Row with Anchored Resistance  - 2-4 x daily - 4-5 x weekly - 1-2 sets - 10-15 reps - Standing Single Arm Shoulder Abduction with Resistance  - 2-4 x daily - 1 sets - 10-15 reps - 2-3 sec hold - Standing Bicep Curls with Resistance  - 2-4 x daily - 1-2 sets - 10-15 reps - Tricep Kick Back with Resistance  -  2-4 x daily - 1-2 sets - 10-15 reps - Hammer Stretch or Strength   - 2-4 x daily - 4-5 x weekly - 1-2 sets - 10-15 reps - Wrist Extension with Resistance  - 2-4 x daily - 1-2 sets - 10-15 reps - Wrist Flexion with Resistance  - 2-4 x daily - 1-2 sets - 10-15 reps     PATIENT EDUCATION: Education details: See tx section above for details  Person educated: Patient Education method: Verbal Instruction, Teach back, Handouts  Education comprehension: States and demonstrates understanding, Additional Education required    HOME EXERCISE PROGRAM: Access Code: [email or web address] URL: [email or web address] Date: 01/24/2024 Prepared by: Melvenia Ada   GOALS: Goals reviewed with patient? Yes   SHORT TERM GOALS: (STG required if POC>30 days) Target Date: 02/08/24  Pt will obtain protective, custom orthotic. Goal status: 01/24/24: MET  2.  Pt will demo/state understanding of initial HEP to improve pain levels and prerequisite motion. Goal status: 02/04/2024: Met   LONG TERM GOALS: Target Date: 03/21/24  Pt will improve functional ability by decreased impairment per PSFS  assessment from 0.5 to 6.5 or better, for better quality of life. Goal status: INITIAL  2.  Pt will improve grip strength in Lt hand from unsafe to test to at least 40lbs for functional use at home and in IADLs. Goal status: INITIAL  3.  Pt will improve A/ROM in Lt wrist flex/ext from 12/3 degrees respectively, to at least 55* each, to have functional motion for tasks like reach and grasp.  Goal status: INITIAL  4.  Pt will improve strength in Lt hand/arm from apparent 3-/5 MMT to at least 4+/5 MMT to have increased functional ability to carry out selfcare and higher-level homecare tasks with less difficulty. Goal status: INITIAL  5.  Pt will improve coordination skills in Lt hand/arm, as seen by within functional limit score on 9HPT testing to have increased functional ability to carry out fine motor tasks (fasteners, etc.) and more complex, coordinated IADLs (meal prep, sports, etc.).  Goal status: INITIAL  6.  Pt will decrease pain at worst from 6-7/10 to 3/10 or better to have better sleep and occupational participation in daily roles. Goal status: INITIAL   ASSESSMENT:  CLINICAL IMPRESSION: 03/04/24: 2 weeks left on current plan of care.  Now tolerating whole upper body strengthening.  Physician states she is healed well   02/25/24: OT was fairly surprised that she had little to no tenderness or pain during manual muscle testing today, even at often sores ulnar styloid area.  Due to that, OT allows for some early strengthening but this must be done without much repetition or pain.  She should still focus on mobility.  Her dynamic orthosis is still working, JAS brace may be of benefit to her, and if she has to return it to her friend that loaned it to her she may wish to get her which will need doctor's clearance and approval to put through for insurance purposes.     PLAN:  OT FREQUENCY: 1-2x/week  OT DURATION: 8 weeks through 03/21/24 and up to 12 total visits as needed    PLANNED INTERVENTIONS: 97535 self care/ADL training, 02889 therapeutic exercise, 97530 therapeutic activity, 97112 neuromuscular re-education, 97140 manual therapy, 97035 ultrasound, 97032 electrical stimulation (manual), 97760 Orthotic Initial, [id number] Orthotic/Prosthetic subsequent, compression bandaging, Dry needling, energy conservation, coping strategies training, and patient/family education  RECOMMENDED OTHER SERVICES: none now    CONSULTED AND AGREED WITH PLAN OF CARE: Patient  PLAN  FOR NEXT SESSION:   Keep working on whole upper body strengthening, mobilizing stiffness, try UB E for functional activities    Melvenia Ada, OTR/L, CHT  03/04/2024, 12:51 PM

## 2024-03-04 ENCOUNTER — Ambulatory Visit (INDEPENDENT_AMBULATORY_CARE_PROVIDER_SITE_OTHER): Payer: Self-pay

## 2024-03-04 ENCOUNTER — Ambulatory Visit (INDEPENDENT_AMBULATORY_CARE_PROVIDER_SITE_OTHER): Admitting: Rehabilitative and Restorative Service Providers"

## 2024-03-04 ENCOUNTER — Ambulatory Visit (INDEPENDENT_AMBULATORY_CARE_PROVIDER_SITE_OTHER): Admitting: Orthopedic Surgery

## 2024-03-04 ENCOUNTER — Encounter: Payer: Self-pay | Admitting: Rehabilitative and Restorative Service Providers"

## 2024-03-04 DIAGNOSIS — Z9889 Other specified postprocedural states: Secondary | ICD-10-CM

## 2024-03-04 DIAGNOSIS — M6281 Muscle weakness (generalized): Secondary | ICD-10-CM | POA: Diagnosis not present

## 2024-03-04 DIAGNOSIS — M25642 Stiffness of left hand, not elsewhere classified: Secondary | ICD-10-CM | POA: Diagnosis not present

## 2024-03-04 DIAGNOSIS — Z8781 Personal history of (healed) traumatic fracture: Secondary | ICD-10-CM

## 2024-03-04 DIAGNOSIS — R278 Other lack of coordination: Secondary | ICD-10-CM | POA: Diagnosis not present

## 2024-03-04 DIAGNOSIS — R600 Localized edema: Secondary | ICD-10-CM

## 2024-03-04 DIAGNOSIS — M25532 Pain in left wrist: Secondary | ICD-10-CM

## 2024-03-04 DIAGNOSIS — M25632 Stiffness of left wrist, not elsewhere classified: Secondary | ICD-10-CM

## 2024-03-04 NOTE — Progress Notes (Signed)
   Jacqueline Michael - 53 y.o. female MRN [id number]  Date of birth: 15-Apr-1971  Office Visit Note: Visit Date: 03/04/2024 PCP: Vannie Senior, MD Referred by: Vannie Senior, MD  Subjective:  HPI: Jacqueline Michael is a 53 y.o. female who presents today for follow up 8 week status post open reduction internal fixation of left distal radius fracture.  She is doing well overall, has been doing occupational therapy activities as instructed with progression of range of motion.  Pain is controlled, does have some ongoing ulnar-sided soreness.  Pertinent ROS were reviewed with the patient and found to be negative unless otherwise specified above in HPI.   Assessment & Plan: Visit Diagnoses:  1. S/P ORIF (open reduction internal fixation) fracture     Plan: She is doing very well postoperatively.  X-rays obtained today show stable appearance of the distal radius fracture with appropriate hardware fixation and interval healing.  At this juncture, we can progress to strengthening with occupational therapy.  Continue with range of motion exercises.  She was counseled on slowly advancing her activities from a strengthening standpoint.  She will return in approximately 4 weeks for recheck.  Follow-up: No follow-ups on file.   Meds & Orders: No orders of the defined types were placed in this encounter.   Orders Placed This Encounter  Procedures   XR Wrist Complete Left     Procedures: No procedures performed       Objective:   Vital Signs: LMP 06/13/2016   Ortho Exam Left forearm: - Well-healed volar incision, no erythema or drainage - Digital range of motion is appropriate, able to perform composite fist without significant restriction - AIN/PIN/interosseous intact - Sensation intact in median/radial/ulnar distributions - Hand remains warm well-perfused - Wrist range of motion flexion/extension 55/50 - Pronation/supination forearm 50/80, no evidence of DRUJ instability with stress  testing in all planes  Imaging: XR Wrist Complete Left Result Date: 03/04/2024 X-rays demonstrate stable appearance of the distal radius fracture with appropriate hardware fixation.  No evidence of hardware failure or migration.  Ulnar styloid fracture once again visualized.    Renard Caperton Afton Alderton, M.D. [facility], Hand Surgery

## 2024-03-06 NOTE — Telephone Encounter (Signed)
 Appeal letter sent to [employer] [place] appeals dept for denied [place]  claim DOS: 12/11/22.

## 2024-03-11 NOTE — Therapy (Signed)
 OUTPATIENT OCCUPATIONAL THERAPY TREATMENT NOTE   Patient Name: Jacqueline Michael MRN: [id number] DOB:February 20, 1971, 53 y.o., female Today's Date: 03/12/2024  PCP: Vannie EDISON MD REFERRING PROVIDER: Dr Arlinda    END OF SESSION:  OT End of Session - 03/12/24 1604     Visit Number 11    Number of Visits 12    Date for OT Re-Evaluation 03/21/24    Authorization Type [employer]    OT Start Time 1604    OT Stop Time 1653    OT Time Calculation (min) 49 min    Equipment Utilized During Treatment orthotic materials    Activity Tolerance Patient tolerated treatment well;Patient limited by pain;No increased pain;Patient limited by fatigue    Behavior During Therapy [facility] for tasks assessed/performed           History reviewed. No pertinent past medical history. History reviewed. No pertinent surgical history. Patient Active Problem List   Diagnosis Date Noted   OP (osteoporosis) 12/11/2022   Insufficiency fracture of tibia 08/30/2022   Nondisplaced fracture of lateral malleolus of left fibula, subsequent encounter for closed fracture with routine healing 08/14/2022   Osteoporosis 05/16/2021   Right knee pain 01/04/2021   Closed left cuboid fracture 11/15/2017   Hamstring tendonitis at origin 01/28/2014   HIP PAIN, RIGHT 04/27/2009   FOOT PAIN, BILATERAL 04/27/2009   BUNION, RIGHT FOOT 12/31/2007   Closed fracture of sacrum and coccyx (HCC) 12/31/2007    ONSET DATE: 01/10/24 DOS  REFERRING DIAG: F74.467 (ICD-10-CM) - Pain in left wrist   THERAPY DIAG:  Muscle weakness (generalized)  Localized edema  Other lack of coordination  Stiffness of left hand, not elsewhere classified  Pain in left wrist  Stiffness of left wrist, not elsewhere classified  Rationale for Evaluation and Treatment: Rehabilitation  PERTINENT HISTORY: left wrist distal radius fracture with intra-articular extension, comminution and notable displacement. Was also found to have small ulnar styloid fracture as  well. She underwent bedside closed reduction and splinting at that time.  she slipped and fell in a wet spa in [place] and flew home on an emergency basis, after the fracture was set and casted.  Very adventurous person with hiking, climbing, etc. She has been walking and staying active.  Her hand and fingers look very stiff today at evaluation.  Per the doctor, her wrist was very badly damaged and this injury and therapy should proceed gently.  PRECAUTIONS: None  RED FLAGS:  None   WEIGHT BEARING RESTRICTIONS: Yes no significant weightbearing in the left hand and arm for the next 4 weeks   SUBJECTIVE:   SUBJECTIVE STATEMENT: She is now ~9 weeks s/p L DRF, ulnar styloid fxs.  She states having some feeling of new swelling and stiffness in her fingers now which may be linked to painful new resistive exercises.  OT advises her to slow down with these and stop any if they are sharply painful    PAIN:  Are you having pain? Yes: NPRS scale: 0-1/10 at rest and up to 3-4/10 at worst in past week  Pain location: Left wrist Pain description: Aching Aggravating factors: Attempted movement Relieving factors: Rest and ice   PATIENT GOALS: To quickly return to full functional ability for her many planned trips and adventures  NEXT MD VISIT: 02/11/2024   OBJECTIVE: (All objective assessments below are from initial evaluation on: 01/24/24 unless otherwise specified.)   HAND DOMINANCE: Right   ADLs: Overall ADLs: States her mobility and work ability is improving, though typing is  still difficult due to poor pronation   FUNCTIONAL OUTCOME MEASURES: 02/25/24:  PSFS: 5.6   Eval: Patient Specific Functional Scale: 0.5 (typing, hair brush, holding wheel or eliptical)  (Higher Score  =  Better Ability for the Selected Tasks)      UPPER EXTREMITY ROM     Shoulder to Wrist AROM Left eval Lt 01/30/24 Lt 02/01/24 Lt 02/03/34 Lt 02/06/24 Lt 02/11/24 LT 02/13/24 Lt 02/19/24 Lt 02/25/24 Lt 03/04/24  Lt 03/12/24  Elbow flexion 153            Elbow extension 0            Forearm supination 59 31 50 61 58 65 77 79 82 83  85  Forearm pronation  10 29 42 39 54 46 69 50 50 45  43  Wrist flexion 12 20 20 30 20  (34 PROM) 33 24 38 43 57 50  Wrist extension 3 15 20 23 30  32 32 41 47 51 44  Wrist ulnar deviation 2 10           Wrist radial deviation 9 12           Functional dart thrower's motion (F-DTM) in ulnar flexion             F-DTM in radial extension              (Blank rows = not tested)   Hand AROM Left eval Lt 02/01/24 Lt  02/04/24 Lt 02/06/24 LT 02/11/24 Lt 02/19/24 Lt 02/25/24  Full Fist Ability (or Gap to Distal Palmar Crease) 7cm gap from tip of MF to [facility] 3.7  cm gap from tip of MF to [facility] Digits 4,5 touch palm, digit 3 is close, digit 2 is most limited       Thumb Opposition  (Kapandji Scale)  6/10 8/10  9/10     Thumb MCP (0-60)   0-50      Thumb IP (0-80)   0-10      Thumb Radial Abduction Span          Thumb Palmar Abduction Span          Index MCP (0-90)    0- 77 71 0- 78 0- 75 0- 62  Index PIP (0-100)    (-8)- 49 67 0 -83 0- 91 0- 92  Index DIP (0-70)    0- 14 31 0- 38 0- 46 0- 61  Long MCP (0-90)          Long PIP (0-100)          Long DIP (0-70)          Ring MCP (0-90)          Ring PIP (0-100)          Ring DIP (0-70)          Little MCP (0-90)          Little PIP (0-100)          Little DIP (0-70)          (Blank rows = not tested)   UPPER EXTREMITY MMT:     MMT Left 02/25/24  Shoulder flexion   Shoulder abduction   Shoulder adduction   Shoulder extension   Shoulder internal rotation   Shoulder external rotation   Middle trapezius   Lower trapezius   Elbow flexion   Elbow extension   Forearm supination 4+/5  Forearm pronation 4/5 slightly tender  Wrist flexion 4+/5  Wrist extension 4-/5  Wrist ulnar deviation 4-/5 tender  Wrist radial deviation 4/5 tender  (Blank rows = not tested)  HAND FUNCTION: 02/25/24: Grip Lt 26.7#, tip pinch 6#     02/11/24: grip Lt: 19.5#; Lt tip pinch 4#   (Rt was ~14#)    Eval: Observed weakness in affected left hand.  Grip strength Right: 54.5 lbs, Left: 15.5 lbs   COORDINATION: 02/06/24: 9HPT Lt 16 sec completely functional and better than average.    Eval: Observed coordination impairments with affected Lt hand. 9 Hole Peg Test Left: 47 sec (25 sec is WFL)   SENSATION: Eval:  Light touch intact today, though mildly diminished/hypersensitive around sx area    EDEMA:   Eval:  Mildly swollen in left hand and wrist today   OBSERVATIONS:   01/30/2024: She apparently has some scar adhesion for FDP to the index finger as this finger has a difficulty extending and also difficulty actively flexing and some scar motion is seen.  Thumb is also mildly stiff and adherent.  This is somewhat shocking at only 3 weeks postop.  It may be due to the severity of her fracture and the delay with fracture to surgery.      TODAY'S TREATMENT:  03/12/24: She starts with active range of motion for exercise as well as new measures which shows stiffness and no significant progress since last seen.  She is somewhat demoralized by this and states that she has been working very hard every day.  She also admits to having some sharp pains through the wrist with certain exercises and complains about having increase in swelling and stiffness in her fingers.  OT spent some time discussing self-care with her including not performing exercises if they are sharply painful as this likely shows her bones are not healed well enough to tolerate that.  Her ulnar styloid has been a known unstable fracture, and her DRUJ is affected by this break.  Rotational motions have been hurting her, and OT recommends to do these now with isometric activities carefully versus movement isokinetic activities with resistance bands.  She was educated on isometric wrist flexion and extension as well as forearm motions and with deviations.  If any movement  with resistance is tender or painful she should avoid this and instead do this isometrically with the palm facing up which is a stable position for the DRUJ.  OT also adjusted her dynamic mobilization orthosis to allow more freedom of motion with wrist flexion and extension now.  OT also shortness to elastic strap to allow for more motion.  It fits and works well still after this.  We reviewed prior stretches as well as stretches for pronation and supination, and she was told to do these in a relaxed manner as she has been fighting and forcing herself.  She needs to relax, and keep stretches tolerably light.  We review wear of safety orthosis for her upcoming trip as needed, and discussed some functional ability with typing and other skills at work.  It is unfortunate, but OT advises that she needs to just wait longer for healing to occur as she seems limited now by pain and instability through the DRUJ and ulnar styloid.    PATIENT EDUCATION: Education details: See tx section above for details  Person educated: Patient Education method: Verbal Instruction, Teach back, Handouts  Education comprehension: States and demonstrates understanding, Additional Education required    HOME EXERCISE PROGRAM: Access Code: [email or web address] URL: [email or web address] Date: 01/24/2024 Prepared by:  Melvenia Ada   GOALS: Goals reviewed with patient? Yes   SHORT TERM GOALS: (STG required if POC>30 days) Target Date: 02/08/24  Pt will obtain protective, custom orthotic. Goal status: 01/24/24: MET  2.  Pt will demo/state understanding of initial HEP to improve pain levels and prerequisite motion. Goal status: 02/04/2024: Met   LONG TERM GOALS: Target Date: 03/21/24  Pt will improve functional ability by decreased impairment per PSFS assessment from 0.5 to 6.5 or better, for better quality of life. Goal status: INITIAL  2.  Pt will improve grip strength in Lt hand from unsafe to test to at  least 40lbs for functional use at home and in IADLs. Goal status: INITIAL  3.  Pt will improve A/ROM in Lt wrist flex/ext from 12/3 degrees respectively, to at least 55* each, to have functional motion for tasks like reach and grasp.  Goal status: INITIAL  4.  Pt will improve strength in Lt hand/arm from apparent 3-/5 MMT to at least 4+/5 MMT to have increased functional ability to carry out selfcare and higher-level homecare tasks with less difficulty. Goal status: INITIAL  5.  Pt will improve coordination skills in Lt hand/arm, as seen by within functional limit score on 9HPT testing to have increased functional ability to carry out fine motor tasks (fasteners, etc.) and more complex, coordinated IADLs (meal prep, sports, etc.).  Goal status: INITIAL  6.  Pt will decrease pain at worst from 6-7/10 to 3/10 or better to have better sleep and occupational participation in daily roles. Goal status: INITIAL   ASSESSMENT:  CLINICAL IMPRESSION: 03/12/24: She still having sharp pain in the DRUJ/ulnar styloid with rotational motions, and she needs to be cautious with this and allow for healing otherwise she may be causing more pain and swelling and actually set herself back.  She needs a progress note in the next session but likely will benefit from additional therapy after that due to her lingering symptoms.   03/04/24: 2 weeks left on current plan of care.  Now tolerating whole upper body strengthening.  Physician states she is healed well   02/25/24: OT was fairly surprised that she had little to no tenderness or pain during manual muscle testing today, even at often sores ulnar styloid area.  Due to that, OT allows for some early strengthening but this must be done without much repetition or pain.  She should still focus on mobility.  Her dynamic orthosis is still working, JAS brace may be of benefit to her, and if she has to return it to her friend that loaned it to her she may wish to get her  which will need doctor's clearance and approval to put through for insurance purposes.     PLAN:  OT FREQUENCY: 1-2x/week  OT DURATION: 8 weeks through 03/21/24 and up to 12 total visits as needed   PLANNED INTERVENTIONS: 97535 self care/ADL training, 02889 therapeutic exercise, 97530 therapeutic activity, 97112 neuromuscular re-education, 97140 manual therapy, 97035 ultrasound, 97032 electrical stimulation (manual), 97760 Orthotic Initial, [id number] Orthotic/Prosthetic subsequent, compression bandaging, Dry needling, energy conservation, coping strategies training, and patient/family education  RECOMMENDED OTHER SERVICES: none now    CONSULTED AND AGREED WITH PLAN OF CARE: Patient  PLAN FOR NEXT SESSION:   PROG    Melvenia Ada, OTR/L, CHT  03/12/2024, 5:09 PM

## 2024-03-12 ENCOUNTER — Ambulatory Visit (INDEPENDENT_AMBULATORY_CARE_PROVIDER_SITE_OTHER): Admitting: Rehabilitative and Restorative Service Providers"

## 2024-03-12 ENCOUNTER — Encounter: Payer: Self-pay | Admitting: Rehabilitative and Restorative Service Providers"

## 2024-03-12 DIAGNOSIS — M25632 Stiffness of left wrist, not elsewhere classified: Secondary | ICD-10-CM

## 2024-03-12 DIAGNOSIS — M25532 Pain in left wrist: Secondary | ICD-10-CM

## 2024-03-12 DIAGNOSIS — R278 Other lack of coordination: Secondary | ICD-10-CM | POA: Diagnosis not present

## 2024-03-12 DIAGNOSIS — R600 Localized edema: Secondary | ICD-10-CM

## 2024-03-12 DIAGNOSIS — M25642 Stiffness of left hand, not elsewhere classified: Secondary | ICD-10-CM | POA: Diagnosis not present

## 2024-03-12 DIAGNOSIS — M6281 Muscle weakness (generalized): Secondary | ICD-10-CM | POA: Diagnosis not present

## 2024-03-17 NOTE — Therapy (Signed)
 OUTPATIENT OCCUPATIONAL THERAPY TREATMENT& PROGRESS  NOTE   Patient Name: Jacqueline Michael MRN: [id number] DOB:1971-05-05, 53 y.o., female Today's Date: 03/18/2024  PCP: Vannie EDISON MD REFERRING PROVIDER: Dr Arlinda             Progress Note Reporting Period 01/24/24 to 03/18/24 CLINICAL IMPRESSION: 03/18/24:  Thumb and finger motion now complete within normal limits.  Tolerating weighted dynamic activities now, and she is motivated to continue therapy biweekly to meet remaining long-term goals of wrist motion, grip strength, increased functional ability.  She has met 3 out of 6 of her long-term goals and is doing very well.   PLAN:  OT FREQUENCY: biweekly   OT DURATION: 6 additional weeks through 03/21/24  -05/02/24  and up to 15 total visits as needed     See note below for Objective Data and Assessment of Progress/Goals.               END OF SESSION:  OT End of Session - 03/18/24 1616     Visit Number 12    Number of Visits 15    Date for OT Re-Evaluation 05/02/24    Authorization Type [employer]    OT Start Time 1615    OT Stop Time 1700    OT Time Calculation (min) 45 min    Equipment Utilized During Treatment --    Activity Tolerance Patient tolerated treatment well;No increased pain;Patient limited by fatigue    Behavior During Therapy [facility] for tasks assessed/performed            No past medical history on file. No past surgical history on file. Patient Active Problem List   Diagnosis Date Noted   OP (osteoporosis) 12/11/2022   Insufficiency fracture of tibia 08/30/2022   Nondisplaced fracture of lateral malleolus of left fibula, subsequent encounter for closed fracture with routine healing 08/14/2022   Osteoporosis 05/16/2021   Right knee pain 01/04/2021   Closed left cuboid fracture 11/15/2017   Hamstring tendonitis at origin 01/28/2014   HIP PAIN, RIGHT 04/27/2009   FOOT PAIN, BILATERAL 04/27/2009   BUNION, RIGHT FOOT 12/31/2007   Closed  fracture of sacrum and coccyx (HCC) 12/31/2007    ONSET DATE: 01/10/24 DOS  REFERRING DIAG: F74.467 (ICD-10-CM) - Pain in left wrist   THERAPY DIAG:  Muscle weakness (generalized)  Localized edema  Stiffness of left hand, not elsewhere classified  Other lack of coordination  Pain in left wrist  Stiffness of left wrist, not elsewhere classified  Rationale for Evaluation and Treatment: Rehabilitation  PERTINENT HISTORY: left wrist distal radius fracture with intra-articular extension, comminution and notable displacement. Was also found to have small ulnar styloid fracture as well. She underwent bedside closed reduction and splinting at that time.  she slipped and fell in a wet spa in [place] and flew home on an emergency basis, after the fracture was set and casted.  Very adventurous person with hiking, climbing, etc. She has been walking and staying active.  Her hand and fingers look very stiff today at evaluation.  Per the doctor, her wrist was very badly damaged and this injury and therapy should proceed gently.  PRECAUTIONS: None  RED FLAGS:  None   WEIGHT BEARING RESTRICTIONS: WBAT now   SUBJECTIVE:   SUBJECTIVE STATEMENT: She is now ~10 weeks s/p L DRF, ulnar styloid fxs.  She states  doing isometrics, using dynamic brace, etc. not having as much pain and prioritizing resting a bit more    PAIN:  Are you having  pain? Yes: NPRS scale: 0/10 at rest and up to 1/10 at worst in past week  Pain location: Left wrist Pain description: Aching Aggravating factors: Attempted movement Relieving factors: Rest and ice   PATIENT GOALS: To quickly return to full functional ability for her many planned trips and adventures  NEXT MD VISIT: 02/11/2024   OBJECTIVE: (All objective assessments below are from initial evaluation on: 01/24/24 unless otherwise specified.)   HAND DOMINANCE: Right   ADLs: Overall ADLs: States her mobility and work ability is improving, though typing is  still difficult due to poor pronation   FUNCTIONAL OUTCOME MEASURES: 03/18/24: PSFS: 7 for initial selected items, which meets initial long-term goal.  Today she would also like to add cupping/turning motion for eating tasks and doing pushups/yoga.   With the addition of these items she rates PSFS 4.9 today for those 5 items  (cupping/turning, push-ups/yoga, typing, hairbrush, holding wheel/driving)   UPPER EXTREMITY ROM     Shoulder to Wrist AROM Left eval Lt 03/18/24  Elbow flexion 153   Elbow extension 0   Forearm supination 59 88  Forearm pronation  10 62  Wrist flexion 12 46 (85 in Rt)   Wrist extension 3 50 (68 in Rt)   Wrist ulnar deviation 2 28  Wrist radial deviation 9 10  Functional dart thrower's motion (F-DTM) in ulnar flexion    F-DTM in radial extension     (Blank rows = not tested)   Hand AROM Left eval Lt  02/04/24 Lt 03/18/24  Full Fist Ability (or Gap to Distal Palmar Crease) 7cm gap from tip of MF to [facility] Digits 4,5 touch palm, digit 3 is close, digit 2 is most limited  Full fist, WNL  Thumb Opposition  (Kapandji Scale)  6/10  WNL  Thumb MCP (0-60)  0-50 0 - 59 WNL  Thumb IP (0-80)  0-10 0 - 54 WFL  Thumb Radial Abduction Span      Thumb Palmar Abduction Span      Index MCP (0-90)   0- 77 0 - 84  Index PIP (0-100)   (-8)- 49 0 - 100  Index DIP (0-70)   0- 14  0 - 55  (Blank rows = not tested)   UPPER EXTREMITY MMT:     MMT Left 02/25/24 Lt 03/18/24  Elbow flexion  5/5  Elbow extension  5/5  Forearm supination 4+/5 5/5  Forearm pronation 4/5 slightly tender 4+/5 mild tender still  Wrist flexion 4+/5  5/5  Wrist extension 4-/5 5/5  Wrist ulnar deviation 4-/5 tender 5/5  Wrist radial deviation 4/5 tender 4+/5  (Blank rows = not tested)  HAND FUNCTION: 03/18/24: Lt grip : 34.6#;  tip pinch 8#    02/11/24: grip Lt: 19.5#; Lt tip pinch 4#   (Rt was ~14#)    Eval: Observed weakness in affected left hand.  Grip strength Right: 54.5 lbs, Left: 15.5  lbs   COORDINATION: 02/06/24: 9HPT Lt 16 sec completely functional and better than average.     OBSERVATIONS:   03/18/24: She is moving her arm much better, tolerating gripping and weightbearing better, less painful near the ulnar styloid now, pronation still difficult, wrist still a bit stiff.  Still has strange osteophyte volarly near her scar area that bothers her at times but strangely also makes her skin pucker like its some type of scar adhesion.  The doctor has seen this and is aware    TODAY'S TREATMENT:  03/18/24:   Pt performs  AROM, gripping, and strength with left wrist/forearm/arm against therapist's resistance for exercise/activities as well as new measures today.  Results show much better strength and motion now, though lingering stiffness at the wrist and forearm which makes it more difficult for her to type and do her job as well as participate in some IADLs like yoga and self-care management.  OT also discusses home and functional tasks with the pt and because she is doing better with her PSFS rating, OT will upgrade her goal to include new areas of weightbearing through the wrist for yoga as well as using hand with dynamic supination pronation motions for scooping and using hand to eat with.   We also discussed that as she is doing better and healing up, she can be more liberal with weightbearing and exercises.  OT does have her perform weightbearing on the back of a chair to have some wrist extension in a tolerable amount to work on weightbearing and yoga positions.  She did not have pain doing that today.  OT also has her perform dynamic functional reaching bouncing catching and throwing with the left hand with a basketball and also a 2 pound ball.  These things cause fatigue but no major pain.  These type of dynamic activities are now encouraged.  OT again recommends using some kind of supportive wrist bracing for safety when performing higher level activities and clears her for  activities like kayaking to help work on her dynamic strength-though she should be cautious while doing this, expect to get sore and take rest breaks.  She has been resting a bit more and being less forceful, which was recommended, and now she has less tenderness and swelling and is actually moving a bit better.  The rest of her goals were also reviewed together, and she has made a significant portion today-though she does want to continue therapy until all goals are met.    We discussed reducing therapy to biweekly now to reflect her healing and better improvements in status, and also give her time to self manage some of these higher level issues.  OT will request new authorization to cover several more weeks at a biweekly pace.    PATIENT EDUCATION: Education details: See tx section above for details  Person educated: Patient Education method: Verbal Instruction, Teach back, Handouts  Education comprehension: States and demonstrates understanding, Additional Education required    HOME EXERCISE PROGRAM: Access Code: [email or web address] URL: [email or web address] Date: 01/24/2024 Prepared by: Melvenia Ada   GOALS: Goals reviewed with patient? Yes   SHORT TERM GOALS: (STG required if POC>30 days) Target Date: 02/08/24  Pt will obtain protective, custom orthotic. Goal status: 01/24/24: MET  2.  Pt will demo/state understanding of initial HEP to improve pain levels and prerequisite motion. Goal status: 02/04/2024: Met   LONG TERM GOALS: Target Date: 03/21/24  Pt will improve functional ability by decreased impairment per PSFS assessment from 0.5 to 6.5 or better, for better quality of life. Goal status: 03/18/24: MET but will be upgraded to include 2 new tasks   2.  Pt will improve grip strength in Lt hand from unsafe to test to at least 40lbs for functional use at home and in IADLs. Goal status:03/18/24: Improved to 34 pounds now, we will keep working  3.  Pt will improve  A/ROM in Lt wrist flex/ext from 12/3 degrees respectively, to at least 55* each, to have functional motion for tasks like reach and grasp.  Goal status: 03/18/24: now 46/50, improving  4.  Pt will improve strength in Lt hand/arm from apparent 3-/5 MMT to at least 4+/5 MMT to have increased functional ability to carry out selfcare and higher-level homecare tasks with less difficulty. Goal status: 03/18/24: Goal met  5.  Pt will improve coordination skills in Lt hand/arm, as seen by within functional limit score on 9HPT testing to have increased functional ability to carry out fine motor tasks (fasteners, etc.) and more complex, coordinated IADLs (meal prep, sports, etc.).  Goal status:03/18/24: Goal met  6.  Pt will decrease pain at worst from 6-7/10 to 3/10 or better to have better sleep and occupational participation in daily roles. Goal status: 03/18/24: Goal met   ASSESSMENT:  CLINICAL IMPRESSION: 03/18/24:  Thumb and finger motion now complete within normal limits.  Tolerating weighted dynamic activities now, and she is motivated to continue therapy biweekly to meet remaining long-term goals of wrist motion, grip strength, increased functional ability.  She has met 3 out of 6 of her long-term goals and is doing very well.   PLAN:  OT FREQUENCY: biweekly   OT DURATION: 6 additional weeks through 03/21/24  -05/02/24  and up to 15 total visits as needed   PLANNED INTERVENTIONS: 97535 self care/ADL training, 02889 therapeutic exercise, 97530 therapeutic activity, 97112 neuromuscular re-education, 97140 manual therapy, 97035 ultrasound, 97032 electrical stimulation (manual), 97760 Orthotic Initial, [id number] Orthotic/Prosthetic subsequent, compression bandaging, Dry needling, energy conservation, coping strategies training, and patient/family education  RECOMMENDED OTHER SERVICES: none now    CONSULTED AND AGREED WITH PLAN OF CARE: Patient  PLAN FOR NEXT SESSION:   Continue to address dynamic  functional strength and coordination, stiffness in the wrist, weakness of grip.  Try rope activities as well as farmers carry and continue to progress weightbearing through extended wrists   Melvenia Ada, OTR/L, CHT  03/18/2024, 5:33 PM

## 2024-03-18 ENCOUNTER — Ambulatory Visit: Admitting: Rehabilitative and Restorative Service Providers"

## 2024-03-18 DIAGNOSIS — M25632 Stiffness of left wrist, not elsewhere classified: Secondary | ICD-10-CM | POA: Diagnosis not present

## 2024-03-18 DIAGNOSIS — M6281 Muscle weakness (generalized): Secondary | ICD-10-CM

## 2024-03-18 DIAGNOSIS — R600 Localized edema: Secondary | ICD-10-CM

## 2024-03-18 DIAGNOSIS — M25642 Stiffness of left hand, not elsewhere classified: Secondary | ICD-10-CM

## 2024-03-18 DIAGNOSIS — M25532 Pain in left wrist: Secondary | ICD-10-CM

## 2024-03-18 DIAGNOSIS — R278 Other lack of coordination: Secondary | ICD-10-CM

## 2024-04-01 NOTE — Therapy (Signed)
 OUTPATIENT OCCUPATIONAL THERAPY TREATMENT NOTE   Patient Name: Jacqueline Michael MRN: [id number] DOB:28-Feb-1971, 53 y.o., female Today's Date: 04/02/2024  PCP: Vannie EDISON MD REFERRING PROVIDER: Dr Arlinda       END OF SESSION:  OT End of Session - 04/02/24 1101     Visit Number 13    Number of Visits 15    Date for OT Re-Evaluation 05/02/24    Authorization Type [employer]    OT Start Time 1101    OT Stop Time 1148    OT Time Calculation (min) 47 min    Activity Tolerance Patient tolerated treatment well;No increased pain;Patient limited by fatigue    Behavior During Therapy [facility] for tasks assessed/performed           History reviewed. No pertinent past medical history. History reviewed. No pertinent surgical history. Patient Active Problem List   Diagnosis Date Noted   OP (osteoporosis) 12/11/2022   Insufficiency fracture of tibia 08/30/2022   Nondisplaced fracture of lateral malleolus of left fibula, subsequent encounter for closed fracture with routine healing 08/14/2022   Osteoporosis 05/16/2021   Right knee pain 01/04/2021   Closed left cuboid fracture 11/15/2017   Hamstring tendonitis at origin 01/28/2014   HIP PAIN, RIGHT 04/27/2009   FOOT PAIN, BILATERAL 04/27/2009   BUNION, RIGHT FOOT 12/31/2007   Closed fracture of sacrum and coccyx (HCC) 12/31/2007    ONSET DATE: 01/10/24 DOS  REFERRING DIAG: F74.467 (ICD-10-CM) - Pain in left wrist   THERAPY DIAG:  Muscle weakness (generalized)  Localized edema  Stiffness of left hand, not elsewhere classified  Other lack of coordination  Pain in left wrist  Stiffness of left wrist, not elsewhere classified  Rationale for Evaluation and Treatment: Rehabilitation  PERTINENT HISTORY: left wrist distal radius fracture with intra-articular extension, comminution and notable displacement. Was also found to have small ulnar styloid fracture as well. She underwent bedside closed reduction and splinting at that time.  she  slipped and fell in a wet spa in [place] and flew home on an emergency basis, after the fracture was set and casted.  Very adventurous person with hiking, climbing, etc. She has been walking and staying active.  Her hand and fingers look very stiff today at evaluation.  Per the doctor, her wrist was very badly damaged and this injury and therapy should proceed gently.  PRECAUTIONS: None  RED FLAGS:  None   WEIGHT BEARING RESTRICTIONS: WBAT now   SUBJECTIVE:   SUBJECTIVE STATEMENT: She is now ~12 weeks s/p L DRF, ulnar styloid fxs.  She states having more questions about how to improve her wrist motion.  She states her ulnar wrist area feels much better after allowing some rest     PAIN:  Are you having pain? Yes: NPRS scale: 0/10 at rest and up to 1/10 at worst in past week  Pain location: Left wrist Pain description: Aching Aggravating factors: Attempted movement Relieving factors: Rest and ice   PATIENT GOALS: To quickly return to full functional ability for her many planned trips and adventures  NEXT MD VISIT: 02/11/2024   OBJECTIVE: (All objective assessments below are from initial evaluation on: 01/24/24 unless otherwise specified.)   HAND DOMINANCE: Right   ADLs: Overall ADLs: States her mobility and work ability is improving, though typing is still difficult due to poor pronation   FUNCTIONAL OUTCOME MEASURES: 03/18/24: PSFS: 7 for initial selected items, which meets initial long-term goal.  Today she would also like to add cupping/turning motion for eating  tasks and doing pushups/yoga.   With the addition of these items she rates PSFS 4.9 today for those 5 items  (cupping/turning, push-ups/yoga, typing, hairbrush, holding wheel/driving)   UPPER EXTREMITY ROM     Shoulder to Wrist AROM Left eval Lt 03/18/24 Lt 04/02/24  Elbow flexion 153    Elbow extension 0    Forearm supination 59 88 88  Forearm pronation  10 62 60  Wrist flexion 12 46 (85 in Rt)  53  Wrist  extension 3 50 (68 in Rt)  60  Wrist ulnar deviation 2 28   Wrist radial deviation 9 10   Functional dart thrower's motion (F-DTM) in ulnar flexion     F-DTM in radial extension      (Blank rows = not tested)   Hand AROM Left eval Lt  02/04/24 Lt 03/18/24  Full Fist Ability (or Gap to Distal Palmar Crease) 7cm gap from tip of MF to [facility] Digits 4,5 touch palm, digit 3 is close, digit 2 is most limited  Full fist, WNL  Thumb Opposition  (Kapandji Scale)  6/10  WNL  Thumb MCP (0-60)  0-50 0 - 59 WNL  Thumb IP (0-80)  0-10 0 - 54 WFL  Thumb Radial Abduction Span      Thumb Palmar Abduction Span      Index MCP (0-90)   0- 77 0 - 84  Index PIP (0-100)   (-8)- 49 0 - 100  Index DIP (0-70)   0- 14  0 - 55  (Blank rows = not tested)   UPPER EXTREMITY MMT:     MMT Left 02/25/24 Lt 03/18/24  Elbow flexion  5/5  Elbow extension  5/5  Forearm supination 4+/5 5/5  Forearm pronation 4/5 slightly tender 4+/5 mild tender still  Wrist flexion 4+/5  5/5  Wrist extension 4-/5 5/5  Wrist ulnar deviation 4-/5 tender 5/5  Wrist radial deviation 4/5 tender 4+/5  (Blank rows = not tested)  HAND FUNCTION: 03/18/24: Lt grip : 34.6#;  tip pinch 8#    02/11/24: grip Lt: 19.5#; Lt tip pinch 4#   (Rt was ~14#)    Eval: Observed weakness in affected left hand.  Grip strength Right: 54.5 lbs, Left: 15.5 lbs   COORDINATION: 02/06/24: 9HPT Lt 16 sec completely functional and better than average.     OBSERVATIONS:   03/18/24: She is moving her arm much better, tolerating gripping and weightbearing better, less painful near the ulnar styloid now, pronation still difficult, wrist still a bit stiff.  Still has strange osteophyte volarly near her scar area that bothers her at times but strangely also makes her skin pucker like its some type of scar adhesion.  The doctor has seen this and is aware    TODAY'S TREATMENT:  04/02/24: She starts with active range of motion for exercise as well as new measures  which shows significant improvement in wrist flexion and extension now.  Unfortunately pronation is not improving much.  To help her work on her arm new or upgraded goals of dynamic motion and strength, OT educates on newly upgraded closed chain style wrist flexion stretches and reviews using body weight weightbearing and prayer stretching to perform close chain wrist extension stretches.  We also do dynamic strengthening with tricep press with a rope that allows for pronation and wrist flexion as well as bicep curl with a rope that allows for supination and wrist flexion.  She also performs tricep press's with a straight bar to  encourage pronation specifically.  She tolerates these well with 15 and 20 pounds today, respectively.  OT also gives further ideas for weighted stretches for wrist flexion, also using a ball or pillow to keep forearm propped up to allow for maximal wrist flexion.  She was however cautioned that prolonged wrist flexion greater than 50 degrees can and will cause median nerve compression, so she was recommended to instead perform intermittent stretches as previously described.  Lastly we do several functional dynamic strengthening activities including using a tennis racquet to perform dynamic forearm wrist and hand motion, also for hand and backhand striking and overhead serving.  She is encouraged to do these things slowly as quick motion can be as strenuous as a heavy weight.  She then progresses into the use more quickly without significant pain or problems but stating fatigue at the end of the session.     PATIENT EDUCATION: Education details: See tx section above for details  Person educated: Patient Education method: Verbal Instruction, Teach back, Handouts  Education comprehension: States and demonstrates understanding, Additional Education required    HOME EXERCISE PROGRAM: Access Code: [email or web address] URL: [email or web address] Date: 01/24/2024 Prepared by:  Melvenia Ada   GOALS: Goals reviewed with patient? Yes   SHORT TERM GOALS: (STG required if POC>30 days) Target Date: 02/08/24  Pt will obtain protective, custom orthotic. Goal status: 01/24/24: MET  2.  Pt will demo/state understanding of initial HEP to improve pain levels and prerequisite motion. Goal status: 02/04/2024: Met   LONG TERM GOALS: Target Date: 05/02/24  Pt will improve functional ability by decreased impairment per PSFS assessment from 0.5 to 6.5 or better, for better quality of life. Goal status: 03/18/24: MET but will be upgraded to include 2 new tasks   2.  Pt will improve grip strength in Lt hand from unsafe to test to at least 40lbs for functional use at home and in IADLs. Goal status:03/18/24: Improved to 34 pounds now, we will keep working  3.  Pt will improve A/ROM in Lt wrist flex/ext from 12/3 degrees respectively, to at least 55* each, to have functional motion for tasks like reach and grasp.  Goal status: 03/18/24: now 46/50, improving   4.  Pt will improve strength in Lt hand/arm from apparent 3-/5 MMT to at least 4+/5 MMT to have increased functional ability to carry out selfcare and higher-level homecare tasks with less difficulty. Goal status: 03/18/24: Goal met  5.  Pt will improve coordination skills in Lt hand/arm, as seen by within functional limit score on 9HPT testing to have increased functional ability to carry out fine motor tasks (fasteners, etc.) and more complex, coordinated IADLs (meal prep, sports, etc.).  Goal status:03/18/24: Goal met  6.  Pt will decrease pain at worst from 6-7/10 to 3/10 or better to have better sleep and occupational participation in daily roles. Goal status: 03/18/24: Goal met   ASSESSMENT:  CLINICAL IMPRESSION: 04/02/24: She is doing excellent to improve her wrist motion but she is somewhat disappointed by it for pronation.  She is not having any pain and tolerating resistance much better  03/18/24:  Thumb and  finger motion now complete within normal limits.  Tolerating weighted dynamic activities now, and she is motivated to continue therapy biweekly to meet remaining long-term goals of wrist motion, grip strength, increased functional ability.  She has met 3 out of 6 of her long-term goals and is doing very well.   PLAN:  OT FREQUENCY: biweekly   OT DURATION:  6 additional weeks through 03/21/24  -05/02/24  and up to 15 total visits as needed   PLANNED INTERVENTIONS: 97535 self care/ADL training, 02889 therapeutic exercise, 97530 therapeutic activity, 97112 neuromuscular re-education, 97140 manual therapy, 97035 ultrasound, 97032 electrical stimulation (manual), 97760 Orthotic Initial, 97763 Orthotic/Prosthetic subsequent, compression bandaging, Dry needling, energy conservation, coping strategies training, and patient/family education  RECOMMENDED OTHER SERVICES: none now    CONSULTED AND AGREED WITH PLAN OF CARE: Patient  PLAN FOR NEXT SESSION:   Continue to do dynamic functional strengthening to help support her goals of scooping and cupping, push-ups and yoga and other functional activities and work tasks   [employer], OTR/L, CHT  04/02/2024, 6:04 PM

## 2024-04-02 ENCOUNTER — Encounter: Payer: Self-pay | Admitting: Rehabilitative and Restorative Service Providers"

## 2024-04-02 ENCOUNTER — Ambulatory Visit (INDEPENDENT_AMBULATORY_CARE_PROVIDER_SITE_OTHER): Admitting: Orthopedic Surgery

## 2024-04-02 ENCOUNTER — Other Ambulatory Visit (INDEPENDENT_AMBULATORY_CARE_PROVIDER_SITE_OTHER): Payer: Self-pay

## 2024-04-02 ENCOUNTER — Ambulatory Visit (INDEPENDENT_AMBULATORY_CARE_PROVIDER_SITE_OTHER): Admitting: Rehabilitative and Restorative Service Providers"

## 2024-04-02 DIAGNOSIS — Z9889 Other specified postprocedural states: Secondary | ICD-10-CM | POA: Diagnosis not present

## 2024-04-02 DIAGNOSIS — M25632 Stiffness of left wrist, not elsewhere classified: Secondary | ICD-10-CM

## 2024-04-02 DIAGNOSIS — M6281 Muscle weakness (generalized): Secondary | ICD-10-CM | POA: Diagnosis not present

## 2024-04-02 DIAGNOSIS — R600 Localized edema: Secondary | ICD-10-CM | POA: Diagnosis not present

## 2024-04-02 DIAGNOSIS — M25642 Stiffness of left hand, not elsewhere classified: Secondary | ICD-10-CM | POA: Diagnosis not present

## 2024-04-02 DIAGNOSIS — Z8781 Personal history of (healed) traumatic fracture: Secondary | ICD-10-CM

## 2024-04-02 DIAGNOSIS — M25532 Pain in left wrist: Secondary | ICD-10-CM

## 2024-04-02 DIAGNOSIS — R278 Other lack of coordination: Secondary | ICD-10-CM

## 2024-04-02 NOTE — Progress Notes (Signed)
   Jacqueline Michael - 53 y.o. female MRN [id number]  Date of birth: 03-11-1971  Office Visit Note: Visit Date: 04/02/2024 PCP: Vannie Senior, MD Referred by: Vannie Senior, MD  Subjective:  HPI: Jacqueline Michael is a 53 y.o. female who presents today for follow up 12 weeks status post open reduction internal fixation of left distal radius fracture.  She is doing very well overall, has continued to progress her activities nicely.  Is making excellent progress from a range of motion and strengthening standpoint with therapy.  Pertinent ROS were reviewed with the patient and found to be negative unless otherwise specified above in HPI.   Assessment & Plan: Visit Diagnoses:  1. S/P ORIF (open reduction internal fixation) fracture     Plan: She continues to do very well, I have encouraged her to continue advancing her activities as tolerated.  Her strength today is very good as has her range of motion.  She should continue with OT and transition to home exercises when comfortable.  I will plan on seeing her back in approximate 3 months.  I did suggest she return sooner if she has any issues.  She expressed full understanding and is very pleased with her outcome.  Follow-up: No follow-ups on file.   Meds & Orders: No orders of the defined types were placed in this encounter.   Orders Placed This Encounter  Procedures   XR Wrist Complete Left     Procedures: No procedures performed       Objective:   Vital Signs: LMP 06/13/2016   Ortho Exam Left wrist: - Well-healed volar incision, no erythema or drainage - Digital range of motion is appropriate, able to perform composite fist without significant restriction - AIN/PIN/interosseous intact - Sensation intact in median/radial/ulnar distributions - Hand remains warm well-perfused - Wrist range of motion flexion/extension 55/65 - Pronation/supination forearm 85/65, no evidence of DRUJ instability with stress testing in all  planes  Imaging: XR Wrist Complete Left Result Date: 04/02/2024 X-rays demonstrate stable appearance of the distal radius fracture with appropriate hardware fixation.  No evidence of hardware failure or migration.  Ulnar styloid fracture once again visualized with interval healing.    Norena Bratton Afton Alderton, M.D. [facility], Hand Surgery

## 2024-04-14 NOTE — Therapy (Signed)
 OUTPATIENT OCCUPATIONAL THERAPY TREATMENT NOTE   Patient Name: Jacqueline Michael MRN: [id number] DOB:03-12-1971, 53 y.o., female Today's Date: 04/14/2024  PCP: Vannie EDISON MD REFERRING PROVIDER: Dr Arlinda       END OF SESSION:     No past medical history on file. No past surgical history on file. Patient Active Problem List   Diagnosis Date Noted   OP (osteoporosis) 12/11/2022   Insufficiency fracture of tibia 08/30/2022   Nondisplaced fracture of lateral malleolus of left fibula, subsequent encounter for closed fracture with routine healing 08/14/2022   Osteoporosis 05/16/2021   Right knee pain 01/04/2021   Closed left cuboid fracture 11/15/2017   Hamstring tendonitis at origin 01/28/2014   HIP PAIN, RIGHT 04/27/2009   FOOT PAIN, BILATERAL 04/27/2009   BUNION, RIGHT FOOT 12/31/2007   Closed fracture of sacrum and coccyx (HCC) 12/31/2007    ONSET DATE: 01/10/24 DOS  REFERRING DIAG: M25.532 (ICD-10-CM) - Pain in left wrist   THERAPY DIAG:  No diagnosis found.  Rationale for Evaluation and Treatment: Rehabilitation  PERTINENT HISTORY: left wrist distal radius fracture with intra-articular extension, comminution and notable displacement. Was also found to have small ulnar styloid fracture as well. She underwent bedside closed reduction and splinting at that time.  she slipped and fell in a wet spa in [place] and flew home on an emergency basis, after the fracture was set and casted.  Very adventurous person with hiking, climbing, etc. She has been walking and staying active.  Her hand and fingers look very stiff today at evaluation.  Per the doctor, her wrist was very badly damaged and this injury and therapy should proceed gently.  PRECAUTIONS: None  RED FLAGS:  None   WEIGHT BEARING RESTRICTIONS: WBAT now   SUBJECTIVE:   SUBJECTIVE STATEMENT: She is now ~14 weeks s/p L DRF, ulnar styloid fxs.  She states ***    having more questions about how to improve her wrist  motion.  She states her ulnar wrist area feels much better after allowing some rest     PAIN:  Are you having pain? Yes: NPRS scale: *** 0/10 at rest and up to 1/10 at worst in past week  Pain location: Left wrist Pain description: Aching Aggravating factors: Attempted movement Relieving factors: Rest and ice   PATIENT GOALS: To quickly return to full functional ability for her many planned trips and adventures  NEXT MD VISIT: 02/11/2024   OBJECTIVE: (All objective assessments below are from initial evaluation on: 01/24/24 unless otherwise specified.)   HAND DOMINANCE: Right   ADLs: Overall ADLs: States her mobility and work ability is improving, though typing is still difficult due to poor pronation   FUNCTIONAL OUTCOME MEASURES: 04/15/24: PSFS *** (cupping/turning, push-ups/yoga, typing, hairbrush, holding wheel/driving)   1/87/74: PSFS: 7 for initial selected items, which meets initial long-term goal.  Today she would also like to add cupping/turning motion for eating tasks and doing pushups/yoga.   With the addition of these items she rates PSFS 4.9 today for those 5 items  (cupping/turning, push-ups/yoga, typing, hairbrush, holding wheel/driving)   UPPER EXTREMITY ROM     Shoulder to Wrist AROM Left eval Lt 03/18/24 Lt 04/02/24 Lt 04/15/24  Elbow flexion 153     Elbow extension 0     Forearm supination 59 88 88 ***  Forearm pronation  10 62 60 ***  Wrist flexion 12 46 (85 in Rt)  53 ***  Wrist extension 3 50 (68 in Rt)  60 ***  Wrist ulnar  deviation 2 28    Wrist radial deviation 9 10    Functional dart thrower's motion (F-DTM) in ulnar flexion      F-DTM in radial extension       (Blank rows = not tested)   Hand AROM Left eval Lt  02/04/24 Lt 03/18/24  Full Fist Ability (or Gap to Distal Palmar Crease) 7cm gap from tip of MF to [facility] Digits 4,5 touch palm, digit 3 is close, digit 2 is most limited  Full fist, WNL  Thumb Opposition  (Kapandji Scale)  6/10  WNL   Thumb MCP (0-60)  0-50 0 - 59 WNL  Thumb IP (0-80)  0-10 0 - 54 WFL  Thumb Radial Abduction Span      Thumb Palmar Abduction Span      Index MCP (0-90)   0- 77 0 - 84  Index PIP (0-100)   (-8)- 49 0 - 100  Index DIP (0-70)   0- 14  0 - 55  (Blank rows = not tested)   UPPER EXTREMITY MMT:     MMT Left 02/25/24 Lt 03/18/24  Elbow flexion  5/5  Elbow extension  5/5  Forearm supination 4+/5 5/5  Forearm pronation 4/5 slightly tender 4+/5 mild tender still  Wrist flexion 4+/5  5/5  Wrist extension 4-/5 5/5  Wrist ulnar deviation 4-/5 tender 5/5  Wrist radial deviation 4/5 tender 4+/5  (Blank rows = not tested)  HAND FUNCTION: 04/15/24: Grip Lt: ***#; tip pinch ***#   03/18/24: Lt grip : 34.6#;  tip pinch 8#    02/11/24: grip Lt: 19.5#; Lt tip pinch 4#   (Rt was ~14#)    Eval: Observed weakness in affected left hand.  Grip strength Right: 54.5 lbs, Left: 15.5 lbs   COORDINATION: 02/06/24: 9HPT Lt 16 sec completely functional and better than average.     OBSERVATIONS:   03/18/24: She is moving her arm much better, tolerating gripping and weightbearing better, less painful near the ulnar styloid now, pronation still difficult, wrist still a bit stiff.  Still has strange osteophyte volarly near her scar area that bothers her at times but strangely also makes her skin pucker like its some type of scar adhesion.  The doctor has seen this and is aware    TODAY'S TREATMENT:  04/15/24: *** Continue to do dynamic functional strengthening to help support her goals of scooping and cupping, push-ups and yoga and other functional activities and work tasks  dynamic strengthening: UBE Fnl push/pulling ***res x ***min closed chain style wrist flexion stretches dynamic tricep press with a rope 15# Dynamic bicep curl with a rope 20#   weighted stretches for wrist flexion tennis racquet to perform dynamic forearm wrist and hand motion      PATIENT EDUCATION: Education details: See  tx section above for details  Person educated: Patient Education method: Engineer, structural, Teach back, Handouts  Education comprehension: States and demonstrates understanding, Additional Education required    HOME EXERCISE PROGRAM: Access Code: [email or web address] URL: [email or web address] Date: 01/24/2024 Prepared by: Melvenia Ada   GOALS: Goals reviewed with patient? Yes   SHORT TERM GOALS: (STG required if POC>30 days) Target Date: 02/08/24  Pt will obtain protective, custom orthotic. Goal status: 01/24/24: MET  2.  Pt will demo/state understanding of initial HEP to improve pain levels and prerequisite motion. Goal status: 02/04/2024: Met   LONG TERM GOALS: Target Date: 05/02/24  Pt will improve functional ability by decreased impairment per PSFS assessment from 0.5 to 6.5  or better, for better quality of life. Goal status: 03/18/24: MET but will be upgraded to include 2 new tasks   2.  Pt will improve grip strength in Lt hand from unsafe to test to at least 40lbs for functional use at home and in IADLs. Goal status:03/18/24: Improved to 34 pounds now, we will keep working  3.  Pt will improve A/ROM in Lt wrist flex/ext from 12/3 degrees respectively, to at least 55* each, to have functional motion for tasks like reach and grasp.  Goal status: 03/18/24: now 46/50, improving   4.  Pt will improve strength in Lt hand/arm from apparent 3-/5 MMT to at least 4+/5 MMT to have increased functional ability to carry out selfcare and higher-level homecare tasks with less difficulty. Goal status: 03/18/24: Goal met  5.  Pt will improve coordination skills in Lt hand/arm, as seen by within functional limit score on 9HPT testing to have increased functional ability to carry out fine motor tasks (fasteners, etc.) and more complex, coordinated IADLs (meal prep, sports, etc.).  Goal status:03/18/24: Goal met  6.  Pt will decrease pain at worst from 6-7/10 to 3/10 or better to have  better sleep and occupational participation in daily roles. Goal status: 03/18/24: Goal met   ASSESSMENT:  CLINICAL IMPRESSION: 04/15/24: ***  04/02/24: She is doing excellent to improve her wrist motion but she is somewhat disappointed by it for pronation.  She is not having any pain and tolerating resistance much better  03/18/24:  Thumb and finger motion now complete within normal limits.  Tolerating weighted dynamic activities now, and she is motivated to continue therapy biweekly to meet remaining long-term goals of wrist motion, grip strength, increased functional ability.  She has met 3 out of 6 of her long-term goals and is doing very well.   PLAN:  OT FREQUENCY: biweekly   OT DURATION: 6 additional weeks through 03/21/24  -05/02/24  and up to 15 total visits as needed   PLANNED INTERVENTIONS: 97535 self care/ADL training, 02889 therapeutic exercise, 97530 therapeutic activity, 97112 neuromuscular re-education, 97140 manual therapy, 97035 ultrasound, 97032 electrical stimulation (manual), 97760 Orthotic Initial, [id number] Orthotic/Prosthetic subsequent, compression bandaging, Dry needling, energy conservation, coping strategies training, and patient/family education  RECOMMENDED OTHER SERVICES: none now    CONSULTED AND AGREED WITH PLAN OF CARE: Patient  PLAN FOR NEXT SESSION:   ***  Melvenia Ada, OTR/L, CHT  04/14/2024, 5:17 PM

## 2024-04-15 ENCOUNTER — Encounter: Payer: Self-pay | Admitting: Rehabilitative and Restorative Service Providers"

## 2024-04-15 ENCOUNTER — Ambulatory Visit (INDEPENDENT_AMBULATORY_CARE_PROVIDER_SITE_OTHER): Admitting: Rehabilitative and Restorative Service Providers"

## 2024-04-15 DIAGNOSIS — M25642 Stiffness of left hand, not elsewhere classified: Secondary | ICD-10-CM

## 2024-04-15 DIAGNOSIS — M25632 Stiffness of left wrist, not elsewhere classified: Secondary | ICD-10-CM

## 2024-04-15 DIAGNOSIS — R600 Localized edema: Secondary | ICD-10-CM

## 2024-04-15 DIAGNOSIS — M6281 Muscle weakness (generalized): Secondary | ICD-10-CM

## 2024-04-15 DIAGNOSIS — M25532 Pain in left wrist: Secondary | ICD-10-CM

## 2024-04-15 DIAGNOSIS — R278 Other lack of coordination: Secondary | ICD-10-CM | POA: Diagnosis not present

## 2024-04-18 DIAGNOSIS — Z79890 Hormone replacement therapy (postmenopausal): Secondary | ICD-10-CM | POA: Diagnosis not present

## 2024-04-18 DIAGNOSIS — Z13220 Encounter for screening for lipoid disorders: Secondary | ICD-10-CM | POA: Diagnosis not present

## 2024-04-18 DIAGNOSIS — Z0000 Encounter for general adult medical examination without abnormal findings: Secondary | ICD-10-CM | POA: Diagnosis not present

## 2024-04-18 DIAGNOSIS — N951 Menopausal and female climacteric states: Secondary | ICD-10-CM | POA: Diagnosis not present

## 2024-04-29 ENCOUNTER — Encounter: Admitting: Rehabilitative and Restorative Service Providers"

## 2024-06-09 ENCOUNTER — Encounter: Payer: Self-pay | Admitting: Radiology

## 2024-06-24 NOTE — Telephone Encounter (Signed)
 Medical Buy and Bill  Patient is ready for scheduling on or after:07/07/2024  Out-of-pocket cost due at time of visit: $0  Primary: [employer] [place]  [place]  co-insurance: 30% up to $6000 OOP max, ($6000 met) Admin fee co-insurance: 30%  Deductible: $3500 ($3500 met)  Prior Auth: NOT required   ** This summary of benefits is an estimation of the patient's out-of-pocket cost. Exact cost may vary based on individual plan coverage.

## 2024-06-26 ENCOUNTER — Other Ambulatory Visit: Payer: Self-pay | Admitting: *Deleted

## 2024-06-26 DIAGNOSIS — M810 Age-related osteoporosis without current pathological fracture: Secondary | ICD-10-CM

## 2024-07-01 DIAGNOSIS — M810 Age-related osteoporosis without current pathological fracture: Secondary | ICD-10-CM | POA: Diagnosis not present

## 2024-07-02 ENCOUNTER — Ambulatory Visit: Payer: Self-pay | Admitting: Family Medicine

## 2024-07-02 LAB — BASIC METABOLIC PANEL WITH GFR
BUN/Creatinine Ratio: 22 (ref 9–23)
BUN: 19 mg/dL (ref 6–24)
CO2: 22 mmol/L (ref 20–29)
Calcium: 10.5 mg/dL — ABNORMAL HIGH (ref 8.7–10.2)
Chloride: 102 mmol/L (ref 96–106)
Creatinine, Ser: 0.85 mg/dL (ref 0.57–1.00)
Glucose: 80 mg/dL (ref 70–99)
Potassium: 4.6 mmol/L (ref 3.5–5.2)
Sodium: 142 mmol/L (ref 134–144)
eGFR: 82 mL/min/1.73 (ref >59)

## 2024-07-02 LAB — VITAMIN D 25 HYDROXY (VIT D DEFICIENCY, FRACTURES): Vit D, 25-Hydroxy: 36 ng/mL (ref 30.0–100.0)

## 2024-07-07 ENCOUNTER — Ambulatory Visit

## 2024-07-07 ENCOUNTER — Telehealth: Payer: Self-pay

## 2024-07-07 NOTE — Telephone Encounter (Signed)
 Would like her to have a PTH and ionized calcium levels drawn in 6 weeks.  I just sent her a patient message about it.  Thanks!

## 2024-07-07 NOTE — Telephone Encounter (Signed)
 LMOM to r/s appointment on 07/10/24

## 2024-07-08 ENCOUNTER — Other Ambulatory Visit: Payer: Self-pay

## 2024-07-10 ENCOUNTER — Ambulatory Visit: Admitting: Orthopedic Surgery

## 2024-07-11 ENCOUNTER — Ambulatory Visit: Admitting: Family Medicine

## 2024-07-21 ENCOUNTER — Ambulatory Visit

## 2024-07-21 DIAGNOSIS — M810 Age-related osteoporosis without current pathological fracture: Secondary | ICD-10-CM

## 2024-07-21 MED ORDER — DENOSUMAB 60 MG/ML ~~LOC~~ SOSY: 60.0000 mg | PREFILLED_SYRINGE | SUBCUTANEOUS | Status: DC

## 2024-07-21 MED ORDER — DENOSUMAB 60 MG/ML ~~LOC~~ SOSY
60.0000 mg | PREFILLED_SYRINGE | Freq: Once | SUBCUTANEOUS | Status: AC
  Administered 2024-07-21: 12:00:00 60 mg via SUBCUTANEOUS

## 2024-07-21 MED ORDER — DENOSUMAB 60 MG/ML ~~LOC~~ SOSY: 60.0000 mg | PREFILLED_SYRINGE | Freq: Once | SUBCUTANEOUS | Status: DC

## 2024-07-21 NOTE — Progress Notes (Signed)
Patient given [place] prolia injection 60mg/ml in her left arm. Patient tolerated injection well without reaction at the injection site. Patient will schedule next injection, which is 6 months from today.  

## 2024-07-24 NOTE — Progress Notes (Unsigned)
° °  Modene Mcclaren - 53 y.o. female MRN [id number]  Date of birth: November 08, 1970  Office Visit Note: Visit Date: 07/28/2024 PCP: Vannie Senior, MD Referred by: Vannie Senior, MD  Subjective:  HPI: Analisia Kingsford is a 53 y.o. female who presents today for follow up 6 months status post left distal radius fracture, open reduction internal fixation.  Pertinent ROS were reviewed with the patient and found to be negative unless otherwise specified above in HPI.   Assessment & Plan: Visit Diagnoses: No diagnosis found.  Plan: ***  Follow-up: No follow-ups on file.   Meds & Orders: No orders of the defined types were placed in this encounter.  No orders of the defined types were placed in this encounter.    Procedures: No procedures performed       Objective:   Vital Signs: LMP 06/13/2016   Ortho Exam ***  Imaging: No results found.   Allister Lessley Afton Alderton, M.D. [facility], Hand Surgery

## 2024-07-28 ENCOUNTER — Ambulatory Visit: Admitting: Orthopedic Surgery

## 2024-07-28 ENCOUNTER — Other Ambulatory Visit (INDEPENDENT_AMBULATORY_CARE_PROVIDER_SITE_OTHER): Payer: Self-pay

## 2024-07-28 DIAGNOSIS — Z8781 Personal history of (healed) traumatic fracture: Secondary | ICD-10-CM

## 2024-07-28 DIAGNOSIS — Z9889 Other specified postprocedural states: Secondary | ICD-10-CM | POA: Diagnosis not present

## 2024-08-26 LAB — CALCIUM, IONIZED: Calcium, Ion: 5 mg/dL (ref 4.5–5.6)

## 2024-08-26 LAB — PARATHYROID HORMONE, INTACT (NO CA): PTH: 34 pg/mL (ref 15–65)

## 2024-08-27 ENCOUNTER — Ambulatory Visit: Payer: Self-pay | Admitting: Family Medicine

## 2025-01-20 ENCOUNTER — Ambulatory Visit: Admitting: Family Medicine

## 2025-01-26 ENCOUNTER — Ambulatory Visit: Admitting: Orthopedic Surgery
